# Patient Record
Sex: Female | Born: 1990 | Race: Black or African American | Hispanic: No | Marital: Single | Smoking: Former smoker
Health system: Southern US, Community
[De-identification: ages and names within clinical notes are randomized; demographics above are authoritative.]

## PROBLEM LIST (undated history)

## (undated) ENCOUNTER — Inpatient Hospital Stay (HOSPITAL_COMMUNITY): Payer: Self-pay

## (undated) DIAGNOSIS — Z789 Other specified health status: Secondary | ICD-10-CM

## (undated) HISTORY — PX: NO PAST SURGERIES: SHX2092

---

## 2014-09-05 ENCOUNTER — Emergency Department (HOSPITAL_COMMUNITY): Payer: Self-pay

## 2014-09-05 ENCOUNTER — Emergency Department (HOSPITAL_COMMUNITY)
Admission: EM | Admit: 2014-09-05 | Discharge: 2014-09-05 | Disposition: A | Discharge status: Home / Self Care | Payer: Self-pay | Attending: Emergency Medicine | Admitting: Emergency Medicine

## 2014-09-05 DIAGNOSIS — F10920 Alcohol use, unspecified with intoxication, uncomplicated: Secondary | ICD-10-CM

## 2014-09-05 DIAGNOSIS — F10129 Alcohol abuse with intoxication, unspecified: Secondary | ICD-10-CM | POA: Insufficient documentation

## 2014-09-05 LAB — RAPID URINE DRUG SCREEN, HOSP PERFORMED
AMPHETAMINES: NOT DETECTED
BENZODIAZEPINES: NOT DETECTED
Barbiturates: NOT DETECTED
COCAINE: NOT DETECTED
Opiates: NOT DETECTED
Tetrahydrocannabinol: NOT DETECTED

## 2014-09-05 LAB — COMPREHENSIVE METABOLIC PANEL
ALK PHOS: 56 U/L (ref 39–117)
ALT: 26 U/L (ref 0–35)
AST: 35 U/L (ref 0–37)
Albumin: 4.2 g/dL (ref 3.5–5.2)
Anion gap: 13 (ref 5–15)
BUN: 16 mg/dL (ref 6–23)
CALCIUM: 9.4 mg/dL (ref 8.4–10.5)
CO2: 25 meq/L (ref 19–32)
Chloride: 104 mEq/L (ref 96–112)
Creatinine, Ser: 0.95 mg/dL (ref 0.50–1.10)
GFR calc Af Amer: >90 mL/min (ref >90)
GFR, EST NON AFRICAN AMERICAN: 84 mL/min — AB (ref >90)
Glucose, Bld: 95 mg/dL (ref 70–99)
POTASSIUM: 3.9 meq/L (ref 3.7–5.3)
SODIUM: 142 meq/L (ref 137–147)
Total Bilirubin: 0.2 mg/dL — ABNORMAL LOW (ref 0.3–1.2)
Total Protein: 8 g/dL (ref 6.0–8.3)

## 2014-09-05 LAB — ETHANOL: ALCOHOL ETHYL (B): 335 mg/dL — AB (ref 0–11)

## 2014-09-05 LAB — CBC WITH DIFFERENTIAL/PLATELET
BASOS ABS: 0 10*3/uL (ref 0.0–0.1)
Basophils Relative: 1 % (ref 0–1)
EOS PCT: 5 % (ref 0–5)
Eosinophils Absolute: 0.4 10*3/uL (ref 0.0–0.7)
HCT: 38.8 % (ref 36.0–46.0)
Hemoglobin: 13 g/dL (ref 12.0–15.0)
LYMPHS ABS: 2.7 10*3/uL (ref 0.7–4.0)
LYMPHS PCT: 35 % (ref 12–46)
MCH: 29 pg (ref 26.0–34.0)
MCHC: 33.5 g/dL (ref 30.0–36.0)
MCV: 86.4 fL (ref 78.0–100.0)
Monocytes Absolute: 0.6 10*3/uL (ref 0.1–1.0)
Monocytes Relative: 8 % (ref 3–12)
Neutro Abs: 4.1 10*3/uL (ref 1.7–7.7)
Neutrophils Relative %: 51 % (ref 43–77)
Platelets: 244 10*3/uL (ref 150–400)
RBC: 4.49 MIL/uL (ref 3.87–5.11)
RDW: 12.6 % (ref 11.5–15.5)
WBC: 7.9 10*3/uL (ref 4.0–10.5)

## 2014-09-05 LAB — URINALYSIS, ROUTINE W REFLEX MICROSCOPIC
BILIRUBIN URINE: NEGATIVE
GLUCOSE, UA: NEGATIVE mg/dL
Hgb urine dipstick: NEGATIVE
KETONES UR: NEGATIVE mg/dL
Leukocytes, UA: NEGATIVE
Nitrite: NEGATIVE
Protein, ur: NEGATIVE mg/dL
Specific Gravity, Urine: 1.007 (ref 1.005–1.030)
Urobilinogen, UA: 0.2 mg/dL (ref 0.0–1.0)
pH: 6.5 (ref 5.0–8.0)

## 2014-09-05 LAB — PROTIME-INR
INR: 1 (ref 0.00–1.49)
Prothrombin Time: 13.3 seconds (ref 11.6–15.2)

## 2014-09-05 LAB — I-STAT CG4 LACTIC ACID, ED: Lactic Acid, Venous: 1.14 mmol/L (ref 0.5–2.2)

## 2014-09-05 MED ORDER — SODIUM CHLORIDE 0.9 % IV SOLN
1000.0000 mL | Freq: Once | INTRAVENOUS | Status: AC
  Administered 2014-09-05: 1000 mL via INTRAVENOUS

## 2014-09-05 MED ORDER — SODIUM CHLORIDE 0.9 % IV SOLN: 1000.0000 mL | INTRAVENOUS | Status: DC

## 2014-09-05 NOTE — Discharge Instructions (Signed)
Drink alcohol only in moderation.  Return to the ED for any concerning changes in your condition.

## 2014-09-05 NOTE — Progress Notes (Signed)
Chaplain present at pt arrival. Chaplain checked in with pt and made her aware of chaplain services. Chaplain unsure if pt understood based on current condition. Accd to nurse pt lives alone and mother lives out of town. Page chaplain if needed.   09/05/14 0300  Clinical Encounter Type  Visited With Patient;Health care provider  Visit Type Initial;Spiritual support  Stress Factors  Patient Stress Factors None identified  Cynithia Hakimi, Mayer MaskerCourtney F, Chaplain 09/05/2014 3:28 AM

## 2014-09-05 NOTE — ED Notes (Signed)
Food tray ordered

## 2014-09-05 NOTE — ED Notes (Signed)
Patient found naked standing at bedside with her IV pulled out. She was helped back to bed and cleaned up. EDP made aware that patient had no IV access and became combative when told we were going to put another one in. EDP okay with patient not having access. She has been sleeping.

## 2014-09-05 NOTE — ED Provider Notes (Signed)
Pt awake and alert.   Pt wants to go home.  Social worker provided [employer] engine[employer] voucher.  Lonia SkinnerLeslie K Coal Run VillageSofia, PA-C 09/05/14 1015

## 2014-09-05 NOTE — ED Provider Notes (Signed)
Please see my initial note  Gerhard Munchobert Jeffrey Voth, MD 09/05/14 2330
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
## 2015-01-24 ENCOUNTER — Emergency Department (HOSPITAL_COMMUNITY)
Admission: EM | Admit: 2015-01-24 | Discharge: 2015-01-24 | Disposition: A | Discharge status: Home / Self Care | Payer: Self-pay | Attending: Emergency Medicine | Admitting: Emergency Medicine

## 2015-01-24 ENCOUNTER — Emergency Department (HOSPITAL_COMMUNITY): Payer: Self-pay

## 2015-01-24 ENCOUNTER — Encounter (HOSPITAL_COMMUNITY): Payer: Self-pay

## 2015-01-24 DIAGNOSIS — R05 Cough: Secondary | ICD-10-CM | POA: Insufficient documentation

## 2015-01-24 DIAGNOSIS — R059 Cough, unspecified: Secondary | ICD-10-CM

## 2015-01-24 DIAGNOSIS — Z87891 Personal history of nicotine dependence: Secondary | ICD-10-CM | POA: Insufficient documentation

## 2015-01-24 DIAGNOSIS — Z3202 Encounter for pregnancy test, result negative: Secondary | ICD-10-CM | POA: Insufficient documentation

## 2015-01-24 DIAGNOSIS — R1110 Vomiting, unspecified: Secondary | ICD-10-CM | POA: Insufficient documentation

## 2015-01-24 LAB — CBC WITH DIFFERENTIAL/PLATELET
BASOS PCT: 1 % (ref 0–1)
Basophils Absolute: 0 10*3/uL (ref 0.0–0.1)
EOS PCT: 3 % (ref 0–5)
Eosinophils Absolute: 0.2 10*3/uL (ref 0.0–0.7)
HEMATOCRIT: 36.7 % (ref 36.0–46.0)
Hemoglobin: 12.4 g/dL (ref 12.0–15.0)
LYMPHS ABS: 1.3 10*3/uL (ref 0.7–4.0)
LYMPHS PCT: 24 % (ref 12–46)
MCH: 29.5 pg (ref 26.0–34.0)
MCHC: 33.8 g/dL (ref 30.0–36.0)
MCV: 87.2 fL (ref 78.0–100.0)
MONO ABS: 0.5 10*3/uL (ref 0.1–1.0)
MONOS PCT: 9 % (ref 3–12)
NEUTROS ABS: 3.5 10*3/uL (ref 1.7–7.7)
NEUTROS PCT: 63 % (ref 43–77)
Platelets: 261 10*3/uL (ref 150–400)
RBC: 4.21 MIL/uL (ref 3.87–5.11)
RDW: 12.9 % (ref 11.5–15.5)
WBC: 5.5 10*3/uL (ref 4.0–10.5)

## 2015-01-24 LAB — COMPREHENSIVE METABOLIC PANEL
ALBUMIN: 4.1 g/dL (ref 3.5–5.2)
ALT: 21 U/L (ref 0–35)
ANION GAP: 9 (ref 5–15)
AST: 23 U/L (ref 0–37)
Alkaline Phosphatase: 50 U/L (ref 39–117)
BUN: 9 mg/dL (ref 6–23)
CHLORIDE: 106 mmol/L (ref 96–112)
CO2: 25 mmol/L (ref 19–32)
Calcium: 9.1 mg/dL (ref 8.4–10.5)
Creatinine, Ser: 0.66 mg/dL (ref 0.50–1.10)
GLUCOSE: 78 mg/dL (ref 70–99)
Potassium: 3.6 mmol/L (ref 3.5–5.1)
Sodium: 140 mmol/L (ref 135–145)
Total Bilirubin: 0.6 mg/dL (ref 0.3–1.2)
Total Protein: 7.3 g/dL (ref 6.0–8.3)

## 2015-01-24 LAB — LIPASE, BLOOD: LIPASE: 35 U/L (ref 11–59)

## 2015-01-24 LAB — URINALYSIS, ROUTINE W REFLEX MICROSCOPIC
Bilirubin Urine: NEGATIVE
GLUCOSE, UA: NEGATIVE mg/dL
HGB URINE DIPSTICK: NEGATIVE
KETONES UR: NEGATIVE mg/dL
Leukocytes, UA: NEGATIVE
Nitrite: NEGATIVE
PH: 6.5 (ref 5.0–8.0)
Protein, ur: NEGATIVE mg/dL
Specific Gravity, Urine: 1.022 (ref 1.005–1.030)
Urobilinogen, UA: 1 mg/dL (ref 0.0–1.0)

## 2015-01-24 LAB — POC URINE PREG, ED: Preg Test, Ur: NEGATIVE

## 2015-01-24 MED ORDER — PROMETHAZINE HCL 25 MG PO TABS: 25.0000 mg | ORAL_TABLET | Freq: Four times a day (QID) | ORAL | Status: DC | PRN

## 2015-01-24 MED ORDER — BENZONATATE 100 MG PO CAPS: 100.0000 mg | ORAL_CAPSULE | Freq: Three times a day (TID) | ORAL | Status: DC

## 2015-01-24 NOTE — Discharge Instructions (Signed)
Your tests were all normal - tessalon for the cough, phenergan for any nausea - drink plenty of fluids.  Please call your doctor for a followup appointment within 24-48 hours. When you talk to your doctor please let them know that you were seen in the emergency department and have them acquire all of your records so that they can discuss the findings with you and formulate a treatment plan to fully care for your new and ongoing problems.   RESOURCE GUIDE  Chronic Pain Problems: Contact [facility]  [phone] Patients need to be referred by their primary care doctor.  Insufficient Money for Medicine: Contact United Way:  call "211."   No Primary Care Doctor: - Call Health Connect  [phone] - can help you locate a primary care doctor that  accepts your insurance, provides certain services, etc. - Physician Referral Service- [phone]  Agencies that provide inexpensive medical care: - [facility]  [phone] - [facility]  [phone] - [facility]  [phone] - Women's Clinic  [phone] - [facility] Parenthood  [phone] Haynes Bast[facility]  [phone]  Medicaid-accepting [facility] Providers: - [facility]- [address], [address]  [phone], Mon-Fri 9am-7pm, Sat 9am-1pm - [facility]- [address], [address] [place]  [phone] - [facility]- [address], [address] [place]  [phone] [facility]- [facility]- [address] [place] [address]  [phone] - Renaye RakersVeita Bland- [address], [address], [phone]  Only accepts [place] patients after they have their name  applied to their card  Self Pay (no insurance) in [place]: - Sickle Cell Patients: Dr Willey BladeEric Dean, [facility]  [address], [phone] - [facility]- [address]  [phone]       Redge Gainer-     [facility]- [address],  [address]       -     [facility]- see information above (Speak to Cit[employer]Pam H if you do not have insurance)       -  [facility] [place]- [address],  [phone]       -  [facility]- [address] [place] [address], [phone]       -  Dr Julio Sickssei-Bonsu-  [address], [address], [place], [phone]       -  [facility] - [address], [phone]       -  [facility]- [address] [place] [address], [phone], also [address], [phone]       -    [facility]- [address], [phone], 1st & 3rd Saturday        every month, 10am-1pm  [facility] [address] [place], [place] [address] [phone]  [facility] [address] [place], [place] [address] [phone]  1) Find a Doctor and Pay Out of Pocket Although you won't have to find out who is covered by your insurance plan, it is a good idea to ask around and get recommendations. You will then need to call the office and see if the doctor you have chosen will accept you as a new patient and what types of options they offer for patients who are self-pay. Some doctors offer discounts or will set up payment  plans for their patients who do not have insurance, but you will need to ask so you aren't surprised when you get to your appointment.  2) Contact Your Local Health Department Not all health departments have doctors that can see patients for sick visits, but many do, so it is worth a call to see if yours does. If you don't know where your local health department is, you can check in your phone book. The CDC also has a tool to help you locate your state's health department, and many state websites also have listings of all of their local health departments.  3) Find a Walk-in Clinic If your illness is not likely to be very severe or complicated, you may want to try a walk in clinic. These are popping up all over the country in pharmacies,  drugstores, and shopping centers. They're usually staffed by nurse practitioners or physician assistants that have been trained to treat common illnesses and complaints. They're usually fairly quick and inexpensive. However, if you have serious medical issues or chronic medical problems, these are probably not your best option  STD Testing - [facility] Department of [facility], STD Clinic, [address] [place], phone [phone] or [phone].  Monday - Friday, call for an appointment. [facility] Department of [employer], STD Clinic, [address], [place], phone [phone] or [phone].  Monday - Friday, call for an appointment.  Abuse/Neglect: [facility] Child Abuse Hotline 760-182-8096 [facility] Child Abuse Hotline [phone] (After Hours)  Emergency Shelter:  [facility] [phone]  Maternity Homes: - Room at the [place] [facility] [phone] - [facility] [phone]  MRSA Hotline #:   709-663-9220  Dental Assistance If unable to pay or uninsured, contact:  [facility]. to become qualified for the adult dental clinic.  Patients with Medicaid: [facility] [phone] [address], [phone] [address], [phone]  If unable to pay, or uninsured, contact [facility] [phone] in [place], [phone] in [facility]) to become qualified for the adult dental clinic  [facility] [address] [place], [place] [address] [phone] [email or web address]  Other Proofreader Services: - [facility]- [address], [place], [place], [address], [phone], Ext. [phone], 2nd and 4th Thursday of the month at 6:30am.  10 clients each day by appointment, can sometimes see walk-in patients if someone does not show for an appointment. [facility]- [address] [place],  [place], [address], [phone] - [facility]- [address], [place], [place], [address], [phone] - [facility]- [phone] [facility]- [phone] [facility] [phone] -

## 2015-01-24 NOTE — ED Notes (Addendum)
Pt states she's had abdominal pain with N/V for a week.  Pt states she's not able to keep anything down.  Pt also states she needs a tetanus shot.

## 2015-01-24 NOTE — ED Notes (Signed)
Patient returned from X-ray 

## 2015-01-24 NOTE — ED Notes (Signed)
Patient transported to X-ray 

## 2015-01-24 NOTE — ED Provider Notes (Signed)
CSN: [id number]     Arrival date & time 01/24/15  1003 History   First MD Initiated Contact with Patient 01/24/15 1030     Chief Complaint  Patient presents with  . Emesis  . 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 She also uses "popping pills" such as Xanax and Vicodin but prefers cocaine, drinks alcohol daily, has had a cough for approximately 2 weeks which is productive of phlegm, associated with mild fatigue and now states that she is having some nausea and diarrhea every time she eats. Her symptoms are persistent, gradually worsening, nothing makes this better or worse, not associated with rashes.  Patient is a 24 y.o. female presenting with vomiting and abdominal pain. The history is provided by the patient.  Emesis Associated symptoms: abdominal pain   Abdominal Pain Associated symptoms: vomiting     History reviewed. No pertinent past medical history. History reviewed. No pertinent past surgical history. No family history on file. History  Substance Use Topics  . Smoking status: Former Games developermoker  . Smokeless tobacco: Not on file  . Alcohol Use: Yes   OB History    No data available     Review of Systems  Gastrointestinal: Positive for vomiting and abdominal pain.  All other systems reviewed and are negative.     Allergies  Review of patient's allergies indicates no known allergies.  Home Medications   Prior to Admission medications   Medication Sig Start Date End Date Taking? Authorizing Provider  benzonatate (TESSALON) 100 MG capsule Take 1 capsule (100 mg total) by mouth every 8 (eight) hours. 01/24/15   Eber HongBrian Deola Rewis, MD  promethazine (PHENERGAN) 25 MG tablet Take 1 tablet (25 mg total) by mouth every 6 (six) hours as needed for nausea or vomiting. 01/24/15   Eber HongBrian  Janelis Stelzer, MD   BP 108/58 mmHg  Pulse 69  Temp(Src) 98.4 F (36.9 C) (Oral)  Resp 16  Ht 5\' 2"  (1.575 m)  Wt 133 lb 4 oz (60.442 kg)  BMI 24.37 kg/m2  SpO2 97%  LMP 11/25/2014 Physical Exam  Constitutional: She appears well-developed and well-nourished. No distress.  HENT:  Head: Normocephalic and atraumatic.  Mouth/Throat: Oropharynx is clear and moist. No oropharyngeal exudate.  Moist mucous membranes, oropharynx is clear and moist, nasal passages clear, phonation normal  Eyes: Conjunctivae and EOM are normal. Pupils are equal, round, and reactive to light. Right eye exhibits no discharge. Left eye exhibits no discharge. No scleral icterus.  Neck: Normal range of motion. Neck supple. No JVD present. No thyromegaly present.  No lymphadenopathy, very supple neck  Cardiovascular: Normal rate, regular rhythm, normal heart sounds and intact distal pulses.  Exam reveals no gallop and no friction rub.   No murmur heard. Pulmonary/Chest: Effort normal and breath sounds normal. No respiratory distress. She has no wheezes. She has no rales.  Normal lung sounds, speaks in full sentences, appears comfortable  Abdominal: Soft. Bowel sounds are normal. She exhibits no distension and no mass. There is no tenderness.  No abdominal tenderness  Musculoskeletal: Normal range of motion. She exhibits no edema or tenderness.  No signs of track marks, no edema, no swelling, soft compartments, supple joints  Lymphadenopathy:    She has no cervical adenopathy.  Neurological: She is alert. Coordination normal.  Skin: Skin is warm and dry. No rash noted. No erythema.  Psychiatric: She has a normal mood and affect. Her behavior is normal.  Nursing note and vitals reviewed.   ED Course  Procedures (including critical care time) Labs Review Labs Reviewed  CBC WITH DIFFERENTIAL/PLATELET  COMPREHENSIVE METABOLIC PANEL  LIPASE, BLOOD  URINALYSIS, ROUTINE W REFLEX MICROSCOPIC  POC URINE PREG, ED     Imaging Review Dg Chest 2 View  01/24/2015   CLINICAL DATA:  Productive cough for the last 3 or 4 weeks.  EXAM: CHEST  2 VIEW  COMPARISON:  09/05/2014  FINDINGS: The heart size and mediastinal contours are within normal limits. Both lungs are clear. The visualized skeletal structures are unremarkable.  IMPRESSION: No active cardiopulmonary disease.   Electronically Signed   By: Signa Kell M.D.   On: 01/24/2015 12:17      MDM   Final diagnoses:  Cough    Normal vital signs, ongoing coughing, nausea, rule out urinary infection or pregnancy, rule out pneumonia, labs as ordered by nursing however may be low yield this patient appears very well. I counseled the patient at length regarding her drug use and the need to stop, anticipate discharge unless significant findings.  She did not cough or have any difficulty breathing during my exam and evaluation  Labs normal - xray neg, stable for d/c. Referrals to PMD given.  Eber Hong, MD 01/24/15 1339

## 2015-06-11 ENCOUNTER — Emergency Department (HOSPITAL_COMMUNITY)
Admission: EM | Admit: 2015-06-11 | Discharge: 2015-06-11 | Disposition: A | Discharge status: Home / Self Care | Payer: Self-pay | Attending: Emergency Medicine | Admitting: Emergency Medicine

## 2015-06-11 ENCOUNTER — Encounter (HOSPITAL_COMMUNITY): Payer: Self-pay | Admitting: Emergency Medicine

## 2015-06-11 DIAGNOSIS — Z87891 Personal history of nicotine dependence: Secondary | ICD-10-CM | POA: Insufficient documentation

## 2015-06-11 DIAGNOSIS — Y9389 Activity, other specified: Secondary | ICD-10-CM | POA: Insufficient documentation

## 2015-06-11 DIAGNOSIS — W450XXA Nail entering through skin, initial encounter: Secondary | ICD-10-CM | POA: Insufficient documentation

## 2015-06-11 DIAGNOSIS — Z23 Encounter for immunization: Secondary | ICD-10-CM | POA: Insufficient documentation

## 2015-06-11 DIAGNOSIS — S61219A Laceration without foreign body of unspecified finger without damage to nail, initial encounter: Secondary | ICD-10-CM

## 2015-06-11 DIAGNOSIS — Y998 Other external cause status: Secondary | ICD-10-CM | POA: Insufficient documentation

## 2015-06-11 DIAGNOSIS — Y9289 Other specified places as the place of occurrence of the external cause: Secondary | ICD-10-CM | POA: Insufficient documentation

## 2015-06-11 DIAGNOSIS — S61214A Laceration without foreign body of right ring finger without damage to nail, initial encounter: Secondary | ICD-10-CM | POA: Insufficient documentation

## 2015-06-11 MED ORDER — TETANUS-DIPHTH-ACELL PERTUSSIS 5-2.5-18.5 LF-MCG/0.5 IM SUSP
0.5000 mL | Freq: Once | INTRAMUSCULAR | Status: AC
  Administered 2015-06-11: 0.5 mL via INTRAMUSCULAR
  Filled 2015-06-11: qty 0.5

## 2015-06-11 MED ORDER — CEPHALEXIN 500 MG PO CAPS: 500.0000 mg | ORAL_CAPSULE | Freq: Four times a day (QID) | ORAL | Status: DC

## 2015-06-11 MED ORDER — HYDROCODONE-ACETAMINOPHEN 5-325 MG PO TABS: 1.0000 | ORAL_TABLET | Freq: Four times a day (QID) | ORAL | Status: DC | PRN

## 2015-06-11 MED ORDER — LIDOCAINE HCL (PF) 1 % IJ SOLN
5.0000 mL | Freq: Once | INTRAMUSCULAR | Status: AC
  Administered 2015-06-11: 5 mL
  Filled 2015-06-11: qty 5

## 2015-06-11 NOTE — ED Notes (Signed)
Caught right ring finger on a nail at around 0100 this am. Deep lac to finger.

## 2015-06-11 NOTE — Discharge Instructions (Signed)
Absorbable Suture Repair °Absorbable sutures (stitches) hold skin together so you can heal. Keep skin wounds clean and dry for the next 2 to 3 days. Then, you may gently wash your wound and dress it with an antibiotic ointment as recommended. As your wound begins to heal, the sutures are no longer needed, and they typically begin to fall off. This will take 7 to 10 days. After 10 days, if your sutures are loose, you can remove them by wiping with a clean gauze pad or a cotton ball. Do not pull your sutures out. They should wipe away easily. If after 10 days they do not easily wipe away, have your caregiver take them out. Absorbable sutures may be used deep in a wound to help hold it together. If these stitches are below the skin, the body will absorb them completely in 3 to 4 weeks.  °You may need a tetanus shot if: °· You cannot remember when you had your last tetanus shot. °· You have never had a tetanus shot. °If you get a tetanus shot, your arm may swell, get red, and feel warm to the touch. This is common and not a problem. If you need a tetanus shot and you choose not to have one, there is a rare chance of getting tetanus. Sickness from tetanus can be serious. °SEEK IMMEDIATE MEDICAL CARE IF: °· You have redness in the wound area. °· The wound area feels hot to the touch. °· You develop swelling in the wound area. °· You develop pain. °· There is fluid drainage from the wound. °Document Released: 12/07/2004 Document Revised: 01/22/2012 Document Reviewed: 03/21/2011 °ExitCare® Patient Information ©2015 [employer]. This information is not intended to replace advice given to you by your health care provider. Make sure you discuss any questions you have with your health care provider. ° °

## 2015-06-11 NOTE — ED Provider Notes (Signed)
CSN: [id number]     Arrival date & time 06/11/15  3664 History   This chart was scribed for non-physician practitioner, Roxy Horseman, PA-C, working with Rolland Porter, MD by Charline Bills, ED Scribe. This patient was seen in room TR08C/TR08C and the patient's care was started at 10:09 AM.  Chief Complaint  Patient presents with  . Laceration   The history is provided by the patient. No language interpreter was used.   HPI Comments: Chloe Saunders is a 24 y.o. female who presents to the Emergency Department with a chief complaint of a laceration to right ring finger sustained around 10 PM last night. Pt states that she was moving a table when she cut her finger on a nail. She reports associated moderate pain to the affected area that is exacerbated with bending. Bleeding is controlled at this time. Pt's last tetanus is unknown. No h/o DM, HIV or hepatitis.   History reviewed. No pertinent past medical history. History reviewed. No pertinent past surgical history. No family history on file. History  Substance Use Topics  . Smoking status: Former Games developer  . Smokeless tobacco: Not on file  . Alcohol Use: Yes   OB History    No data available     Review of Systems  Constitutional: Negative for fever and chills.  Respiratory: Negative for shortness of breath.   Cardiovascular: Negative for chest pain.  Gastrointestinal: Negative for nausea, vomiting, diarrhea and constipation.  Genitourinary: Negative for dysuria.  Skin: Positive for wound.   Allergies  Review of patient's allergies indicates no known allergies.  Home Medications   Prior to Admission medications   Medication Sig Start Date End Date Taking? Authorizing Provider  benzonatate (TESSALON) 100 MG capsule Take 1 capsule (100 mg total) by mouth every 8 (eight) hours. 01/24/15   Eber Hong, MD  promethazine (PHENERGAN) 25 MG tablet Take 1 tablet (25 mg total) by mouth every 6 (six) hours as needed for nausea or vomiting.  01/24/15   Eber Hong, MD   BP 160/90 mmHg  Pulse 98  Temp(Src) 98.1 F (36.7 C) (Oral)  Resp 19  Ht 5' (1.524 m)  Wt 120 lb (54.432 kg)  BMI 23.44 kg/m2  SpO2 97% Physical Exam  Constitutional: She is oriented to person, place, and time. She appears well-developed and well-nourished. No distress.  HENT:  Head: Normocephalic and atraumatic.  Eyes: Conjunctivae and EOM are normal.  Neck: Neck supple. No tracheal deviation present.  Cardiovascular: Normal rate and intact distal pulses.   Good cap refill.   Pulmonary/Chest: Effort normal. No respiratory distress.  Musculoskeletal: Normal range of motion.  R ring finger flexion and extension strength and ROM 5/5 at isolated interphalangeal joints  Neurological: She is alert and oriented to person, place, and time.  Skin: Skin is warm and dry.  R ring finger: 3 cm linear laceration extending from the R ring distal phalanx to the distal portion of the middle phalanx. No tendon involvement. No foreign body.   Psychiatric: She has a normal mood and affect. Her behavior is normal.  Nursing note and vitals reviewed.  ED Course  Procedures (including critical care time) DIAGNOSTIC STUDIES: Oxygen Saturation is 97% on RA, normal by my interpretation.    LACERATION REPAIR PROCEDURE NOTE The patient's identification was confirmed and consent was obtained. This procedure was performed by Roxy Horseman, PA-C at 11:40 AM. Site: R ring finger Sterile procedures observed Anesthetic used (type and amt): 4 mL of 1% lidocaine without epinephrine  Suture type/size: 5-0 vicryl plus Length: 3 cm # of Sutures: 6 Technique: simple interrupted  Complexity Antibx ointment applied Tetanus ordered Site anesthetized, irrigated with NS, explored without evidence of foreign body, wound well approximated, site covered with dry, sterile dressing.  Patient tolerated procedure well without complications. Instructions for care discussed verbally and  patient provided with additional written instructions for homecare and f/u.  COORDINATION OF CARE: 10:12 AM-Discussed treatment plan which includes sutures  with pt at bedside and pt agreed to plan.   Labs Review Labs Reviewed - No data to display  Imaging Review No results found.   EKG Interpretation None      MDM   Final diagnoses:  Finger laceration, initial encounter    Patient with finger laceration that happened last night.  Some concern about length of time from injury and delayed wound closure.  Discussed with Dr. Fayrene Fearing, who recommends proceeding with primary wound closure provided aggressive rinsing and scrubbing, which was done in the ED.  Will prescribe keflex.  Tdap updated.  Return precautions given.  Patient understands and agrees with the plan.  I personally performed the services described in this documentation, which was scribed in my presence. The recorded information has been reviewed and is accurate.     Roxy Horseman, PA-C 06/11/15 1222  Rolland Porter, MD 06/18/15 [phone]

## 2015-10-25 ENCOUNTER — Emergency Department (HOSPITAL_COMMUNITY)
Admission: EM | Admit: 2015-10-25 | Discharge: 2015-10-26 | Disposition: A | Discharge status: Other Acute Inpatient Hospital | Payer: Medicaid Other | Attending: Emergency Medicine | Admitting: Emergency Medicine

## 2015-10-25 ENCOUNTER — Encounter (HOSPITAL_COMMUNITY): Payer: Self-pay | Admitting: Emergency Medicine

## 2015-10-25 ENCOUNTER — Emergency Department (HOSPITAL_COMMUNITY)
Admission: EM | Admit: 2015-10-25 | Discharge: 2015-10-25 | Disposition: A | Discharge status: Home / Self Care | Payer: Self-pay

## 2015-10-25 DIAGNOSIS — F1320 Sedative, hypnotic or anxiolytic dependence, uncomplicated: Secondary | ICD-10-CM | POA: Insufficient documentation

## 2015-10-25 DIAGNOSIS — F17210 Nicotine dependence, cigarettes, uncomplicated: Secondary | ICD-10-CM | POA: Insufficient documentation

## 2015-10-25 DIAGNOSIS — Z3202 Encounter for pregnancy test, result negative: Secondary | ICD-10-CM | POA: Insufficient documentation

## 2015-10-25 DIAGNOSIS — F1920 Other psychoactive substance dependence, uncomplicated: Secondary | ICD-10-CM

## 2015-10-25 DIAGNOSIS — F1420 Cocaine dependence, uncomplicated: Secondary | ICD-10-CM | POA: Insufficient documentation

## 2015-10-25 DIAGNOSIS — R45851 Suicidal ideations: Secondary | ICD-10-CM | POA: Insufficient documentation

## 2015-10-25 DIAGNOSIS — F1010 Alcohol abuse, uncomplicated: Secondary | ICD-10-CM

## 2015-10-25 LAB — COMPREHENSIVE METABOLIC PANEL
ALT: 45 U/L (ref 14–54)
AST: 66 U/L — ABNORMAL HIGH (ref 15–41)
Albumin: 4.8 g/dL (ref 3.5–5.0)
Alkaline Phosphatase: 56 U/L (ref 38–126)
Anion gap: 15 (ref 5–15)
BILIRUBIN TOTAL: 1.3 mg/dL — AB (ref 0.3–1.2)
BUN: 16 mg/dL (ref 6–20)
CHLORIDE: 106 mmol/L (ref 101–111)
CO2: 20 mmol/L — ABNORMAL LOW (ref 22–32)
Calcium: 9 mg/dL (ref 8.9–10.3)
Creatinine, Ser: 0.63 mg/dL (ref 0.44–1.00)
GFR calc Af Amer: >60 mL/min (ref >60)
Glucose, Bld: 85 mg/dL (ref 65–99)
POTASSIUM: 4 mmol/L (ref 3.5–5.1)
Sodium: 141 mmol/L (ref 135–145)
TOTAL PROTEIN: 8.1 g/dL (ref 6.5–8.1)

## 2015-10-25 LAB — CBC
HCT: 37.5 % (ref 36.0–46.0)
HEMOGLOBIN: 12.4 g/dL (ref 12.0–15.0)
MCH: 29.3 pg (ref 26.0–34.0)
MCHC: 33.1 g/dL (ref 30.0–36.0)
MCV: 88.7 fL (ref 78.0–100.0)
Platelets: 213 10*3/uL (ref 150–400)
RBC: 4.23 MIL/uL (ref 3.87–5.11)
RDW: 13.3 % (ref 11.5–15.5)
WBC: 8 10*3/uL (ref 4.0–10.5)

## 2015-10-25 LAB — ETHANOL: ALCOHOL ETHYL (B): 313 mg/dL — AB (ref <5)

## 2015-10-25 LAB — RAPID URINE DRUG SCREEN, HOSP PERFORMED
Amphetamines: NOT DETECTED
BARBITURATES: NOT DETECTED
Benzodiazepines: POSITIVE — AB
COCAINE: POSITIVE — AB
OPIATES: NOT DETECTED
TETRAHYDROCANNABINOL: NOT DETECTED

## 2015-10-25 LAB — I-STAT BETA HCG BLOOD, ED (MC, WL, AP ONLY)

## 2015-10-25 MED ORDER — HYDROXYZINE HCL 25 MG PO TABS: 25.0000 mg | ORAL_TABLET | Freq: Four times a day (QID) | ORAL | Status: DC | PRN

## 2015-10-25 MED ORDER — LORAZEPAM 1 MG PO TABS: 1.0000 mg | ORAL_TABLET | Freq: Four times a day (QID) | ORAL | Status: DC | PRN

## 2015-10-25 MED ORDER — LORAZEPAM 1 MG PO TABS: 1.0000 mg | ORAL_TABLET | Freq: Three times a day (TID) | ORAL | Status: DC

## 2015-10-25 MED ORDER — LOPERAMIDE HCL 2 MG PO CAPS: 2.0000 mg | ORAL_CAPSULE | ORAL | Status: DC | PRN

## 2015-10-25 MED ORDER — LORAZEPAM 1 MG PO TABS: 0.0000 mg | ORAL_TABLET | Freq: Four times a day (QID) | ORAL | Status: DC

## 2015-10-25 MED ORDER — VITAMIN B-1 100 MG PO TABS: 100.0000 mg | ORAL_TABLET | Freq: Every day | ORAL | Status: DC

## 2015-10-25 MED ORDER — LORAZEPAM 1 MG PO TABS: 1.0000 mg | ORAL_TABLET | Freq: Two times a day (BID) | ORAL | Status: DC

## 2015-10-25 MED ORDER — LORAZEPAM 0.5 MG PO TABS
0.5000 mg | ORAL_TABLET | Freq: Once | ORAL | Status: AC
  Administered 2015-10-25: 0.5 mg via ORAL
  Filled 2015-10-25: qty 1

## 2015-10-25 MED ORDER — LORAZEPAM 1 MG PO TABS: 1.0000 mg | ORAL_TABLET | Freq: Four times a day (QID) | ORAL | Status: DC

## 2015-10-25 MED ORDER — THIAMINE HCL 100 MG/ML IJ SOLN
100.0000 mg | Freq: Once | INTRAMUSCULAR | Status: AC
  Administered 2015-10-25: 100 mg via INTRAMUSCULAR
  Filled 2015-10-25: qty 2

## 2015-10-25 MED ORDER — ADULT MULTIVITAMIN W/MINERALS CH
1.0000 | ORAL_TABLET | Freq: Every day | ORAL | Status: DC
  Administered 2015-10-25: 1 via ORAL
  Filled 2015-10-25: qty 1

## 2015-10-25 MED ORDER — LORAZEPAM 1 MG PO TABS: 0.0000 mg | ORAL_TABLET | Freq: Two times a day (BID) | ORAL | Status: DC

## 2015-10-25 MED ORDER — ONDANSETRON 4 MG PO TBDP: 4.0000 mg | ORAL_TABLET | Freq: Four times a day (QID) | ORAL | Status: DC | PRN

## 2015-10-25 MED ORDER — LORAZEPAM 1 MG PO TABS: 1.0000 mg | ORAL_TABLET | Freq: Every day | ORAL | Status: DC

## 2015-10-25 NOTE — ED Notes (Addendum)
Pt resting quietly in room.  15 minute checks and video monitoring in place.

## 2015-10-25 NOTE — ED Notes (Signed)
Pt wanded by security.  Pt has one patient belonging bag at Triage nurses' desk

## 2015-10-25 NOTE — ED Provider Notes (Signed)
CSN: [id number]     Arrival date & time 10/25/15  1534 History   First MD Initiated Contact with Patient 10/25/15 1621     Chief Complaint  Patient presents with  . detox    . Suicidal     (Consider location/radiation/quality/duration/timing/severity/associated sxs/prior Treatment) HPI   Chloe Saunders is a 24 y.o. female, patient with no significant past medical history, presenting to the ED for alcohol and drug detox and complains of SI. Patient states that she drinks about a gallon of hard liquor per day and has been doing so since she was 16. Patient states that she is sad because she has lost her cousin and her grandmother, both of whom she was close to. Patient states that she is made suicide attempts in the past, to include hanging and drug overdose. Patient states, "I just want to die I've nothing to live for." Patient states, "I plan to hang myself with my belt in the closet. I want to be with Jesus." Patient denies any homicidal ideations. Patient denies any physical pain currently. Patient last drug use was cocaine 3 days ago.    History reviewed. No pertinent past medical history. History reviewed. No pertinent past surgical history. No family history on file. Social History  Substance Use Topics  . Smoking status: Current Every Day Smoker    Types: Cigarettes  . Smokeless tobacco: None  . Alcohol Use: Yes   OB History    No data available     Review of Systems  Psychiatric/Behavioral:       Drug and alcohol detox and suicidal ideations.      Allergies  Review of patient's allergies indicates no known allergies.  Home Medications   Prior to Admission medications   Medication Sig Start Date End Date Taking? Authorizing Provider  naphazoline-pheniramine (NAPHCON-A) 0.025-0.3 % ophthalmic solution Place 1 drop into both eyes 4 (four) times daily as needed for irritation.   Yes Historical Provider, MD   BP 141/87 mmHg  Pulse 98  Temp(Src) 98.5 F (36.9 C)  (Oral)  Resp 24  SpO2 98%  LMP 09/25/2015 Physical Exam  Constitutional: She is oriented to person, place, and time. She appears well-developed and well-nourished. No distress.  HENT:  Head: Normocephalic and atraumatic.  Eyes: Conjunctivae are normal. Pupils are equal, round, and reactive to light.  Cardiovascular: Normal rate, regular rhythm and normal heart sounds.   Pulmonary/Chest: Effort normal and breath sounds normal. No respiratory distress.  Abdominal: Soft. Bowel sounds are normal.  Musculoskeletal: She exhibits no edema or tenderness.  Neurological: She is alert and oriented to person, place, and time.  Skin: Skin is warm and dry. She is not diaphoretic.  Psychiatric:  Patient changes the volume of her voice frequently and seems to begin talking quickly and excitedly for no reason. The patient comments are inappropriate at times, however answers to questions seem to be appropriate. Patient became tearful during the interview. Patient had flight of ideas and would jump from one subject to the next.  Nursing note and vitals reviewed.   ED Course  Procedures (including critical care time) Labs Review Labs Reviewed  COMPREHENSIVE METABOLIC PANEL - Abnormal; Notable for the following:    CO2 20 (*)    AST 66 (*)    Total Bilirubin 1.3 (*)    All other components within normal limits  ETHANOL - Abnormal; Notable for the following:    Alcohol, Ethyl (B) 313 (*)    All other components within normal  limits  URINE RAPID DRUG SCREEN, HOSP PERFORMED - Abnormal; Notable for the following:    Cocaine POSITIVE (*)    Benzodiazepines POSITIVE (*)    All other components within normal limits  CBC  I-STAT BETA HCG BLOOD, ED ([facility], [facility], [facility] ONLY)    Imaging Review No results found. I have personally reviewed and evaluated these images and lab results as part of my medical decision-making.   EKG Interpretation None      MDM   Final diagnoses:  Suicidal ideations  Alcohol  abuse  Drug abuse and dependence (HCC)    Chloe Saunders presents for alcohol and drug detox and expresses suicidal ideations.  This patient has been medically cleared and has no home medications to reorder. Patient was put on CIWA due to her alcohol use. Patient's vitals are stable and within acceptable limits. Patient's UDS was positive for cocaine and benzodiazepines, which is consistent with patient's admission of use.  Anselm Pancoast, PA-C 10/25/15 1834  Lorre Nick, MD 10/28/15 [phone]

## 2015-10-25 NOTE — ED Notes (Signed)
[facility]inda[facility]. Pending [facility] transfer.

## 2015-10-25 NOTE — Discharge Instructions (Signed)
You are discharged to the behavioral health unit

## 2015-10-25 NOTE — ED Notes (Signed)
[facility] transport states may be after 11:30pm for transport.

## 2015-10-25 NOTE — BH Assessment (Signed)
Assessment Note  Chloe Saunders is an 24 y.o. female [facility]. Patient states that she drinks about a gallon of hard liquor per day and has been doing so since she was 16. Patient states that she is sad because she has lost her cousin and her grandmother. She is also sad because she has a bad relationship with her mother. Sts, "I need disability" as well. Writer asked if she is suicidal and she sts, "Hell yeah all the damn time". Patient feels overwhelmed with stress and reports suicidal ideations with plan(s). She has a plan to overdose or hang self with belt in the closet. She has access to pills. She is unable to contract for safety. Patient has a prior history of suicide attempt by overdose. Patient reports depressive symptoms of hopelessness, despair, isolating self from others, and loss of interest in usual pleasures. She has bouts of anxiety. She reports poor sleep and appetite.  Patient reports homicidal thoughts toward, "Chloe Saunders". Sts, "I want to kill that bitch". Patient does not provide further information about "Chloe Saunders". The relationship is unknown. Patient refused to describe reasoning for her HI toward "Chloe Saunders". She denies history of harm to others. She has legal issues associated with trespassing. Patient has to go to court for the charge but doesn't know the date.  Patient denies AVH's. She has a history of substance abuse: Alcohol, Crack Cocaine, and Heroin. SEE ADDITIONAL SOCIAL HISTORY. She denies withdrawal symptoms. No history of seizures.She has a family history of substance abuse stating, "Everyone in my family uses drugs or drinks".   [facility] She denies history of inpatient mental health/substance abuse hospitalizations.    Diagnosis: Alcohol induced mood disorder, Alcohol dependence with uncomplicated withdrawal, Cocaine abuse  Past Medical History: History reviewed. No pertinent past medical  history.  History reviewed. No pertinent past surgical history.  Family History: No family history on file.  Social History:  reports that she has been smoking Cigarettes.  She does not have any smokeless tobacco history on file. She reports that she drinks alcohol. She reports that she uses illicit drugs (Cocaine and Marijuana).  Additional Social History:  Substance #1 Name of Substance 1: alcohol 1 - Age of First Use: 16 1 - Amount (size/oz): "gallons" 1 - Frequency: daily 1 - Duration: 8 years 1 - Last Use / Amount: 12/12 Substance #2 Name of Substance 2: crack/cocaine 2 - Age of First Use: 18 2 - Amount (size/oz): $20  2 - Frequency: daily 2 - Duration: 6 years 2 - Last Use / Amount: 12/12 Substance #3 Name of Substance 3: heroin 3 - Age of First Use: 18 3 - Amount (size/oz): "1 stick" 3 - Frequency: daily 3 - Duration: 6 years 3 - Last Use / Amount: 12/11  CIWA: CIWA-Ar BP: 141/87 mmHg Pulse Rate: 98 COWS:    Allergies: No Known Allergies  Home Medications:  (Not in a hospital admission)  OB/GYN Status:  Patient's last menstrual period was 09/25/2015.  General Assessment Data Location of Assessment: [facility] TTS Assessment: In system Is this a Tele or Face-to-Face Assessment?: Face-to-Face Is this an Initial Assessment or a Re-assessment for this encounter?: Initial Assessment Marital status: Single Maiden name:  ([place]) Is patient pregnant?: No Pregnancy Status: No Living Arrangements: Other (Comment) (homeless) Can pt return to current living arrangement?: Yes Admission Status: Voluntary Is patient capable of signing voluntary admission?: Yes Referral Source: Self/Family/Friend Insurance type:  (Medicaid)  Crisis Care Plan Living Arrangements: Other (Comment) (homeless) Legal Guardian:  (patient denies ) Name of Psychiatrist:  Vesta Saunders) Name of Therapist:  Vesta Saunders )  Education Status Is patient currently in school?: No Current Grade:   (n/a) Highest grade of 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 : Yes (held a knife to her throat) Suicidal Intent: Yes-Currently Present Has patient had any suicidal intent within the past 6 months prior to admission? : Yes Is patient at risk for suicide?: Yes Suicidal Plan?: Yes-Currently Present Has patient had any suicidal plan within the past 6 months prior to admission? : Yes (overdose) Specify Current Suicidal Plan: overdose Access to Means: Yes Specify Access to Suicidal Means: pill access What has been your use of drugs/alcohol within the last 12 months?: alcohol daily, crack/cocaine daily, heroin sporadically along with OTC pills Previous Attempts/Gestures: Yes How many times?: 1 Other Self Harm Risks: drug and alcohol use Triggers for Past Attempts: Family contact Intentional Self Injurious Behavior: None Family Suicide History: No Recent stressful life event(s): Legal Issues, Conflict (Comment), Financial Problems (family issues) Persecutory voices/beliefs?: No Depression: Yes Depression Symptoms: Insomnia, Tearfulness, Fatigue, Loss of interest in usual pleasures, Feeling worthless/self pity Substance abuse history and/or treatment for substance abuse?: Yes Suicide prevention information given to non-admitted patients: Not applicable  Risk to Others within the past 6 months Homicidal Ideation: Yes-Currently Present Does patient have any lifetime risk of violence toward others beyond the six months prior to admission? : No Thoughts of Harm to Others: Yes-Currently Present Comment - Thoughts of Harm to Others: "want to kill that bitch", Chloe Saunders Current Homicidal Intent: No Current Homicidal Plan: No Access to Homicidal Means: No Identified Victim:  Chloe Saunders History of harm to others?: No Assessment of Violence: None Noted Violent Behavior Description:  (patient cooperative) Does patient have access to weapons?: No Criminal Charges Pending?: Yes Describe Pending Criminal Charges: trespassing Does patient have a court date: Yes Court Date:  (unsure) Is patient on probation?: No  Psychosis Hallucinations: None noted Delusions: None noted  Mental Status Report Appearance/Hygiene: In scrubs, Unremarkable Eye Contact: Fair Motor Activity: Unsteady Speech: Loud, Slurred Level of Consciousness: Restless Mood: Anxious, Silly, Pleasant Affect: Blunted Anxiety Level: Moderate Thought Processes: Tangential Judgement: Impaired Orientation: Person, Place, Time, Situation Obsessive Compulsive Thoughts/Behaviors: None  Cognitive Functioning Concentration: Poor Memory: Recent Intact, Remote Intact IQ: Average Insight: Fair Impulse Control: Fair Appetite: Fair Weight Loss:  (none) Weight Gain:  (none) Sleep: Decreased Total Hours of Sleep:  (4-6 hours) Vegetative Symptoms: None  ADLScreening [facility] Assessment Services) Patient's cognitive ability adequate to safely complete daily activities?: Yes Patient able to express need for assistance with ADLs?: Yes Independently performs ADLs?: Yes (appropriate for developmental age)  Prior Inpatient Therapy Prior Inpatient Therapy: No Prior Therapy Dates:  (none) Prior Therapy Facilty/Provider(s):  (none) Reason for Treatment:  (none)  Prior Outpatient Therapy Prior Therapy Dates:  (once) Prior Therapy Facilty/Provider(s):  Museum/gallery curator) Reason for Treatment:  (depression, alcohol dependence) Does patient have an ACCT team?: No Does patient have Intensive In-House Services?  : No Does patient have [facility] services? : Yes Does patient have P4[facility] services?: No  ADL Screening (condition at time of admission) Patient's cognitive ability adequate to safely complete daily activities?:  Yes Is the patient deaf or have  difficulty hearing?: No Does the patient have difficulty seeing, even when wearing glasses/contacts?: No Does the patient have difficulty concentrating, remembering, or making decisions?: No Patient able to express need for assistance with ADLs?: Yes Does the patient have difficulty dressing or bathing?: No Independently performs ADLs?: Yes (appropriate for developmental age) Does the patient have difficulty walking or climbing stairs?: No Weakness of Legs: None Weakness of Arms/Hands: None  Home Assistive Devices/Equipment Home Assistive Devices/Equipment: None    Abuse/Neglect Assessment (Assessment to be complete while patient is alone) Physical Abuse: Denies Verbal Abuse: Denies Sexual Abuse: Denies Exploitation of patient/patient's resources: Denies Self-Neglect: Denies     Merchant navy officer (For Healthcare) Does patient have an advance directive?: No Would patient like information on creating an advanced directive?: No - patient declined information    Additional Information 1:1 In Past 12 Months?: No CIRT Risk: No Elopement Risk: No Does patient have medical clearance?: Yes  Child/Adolescent Assessment Running Away Risk: Denies Bed-Wetting: Denies Destruction of Property: Denies Cruelty to Animals: Denies Stealing: Denies Rebellious/Defies Authority: Denies Satanic Involvement: Denies Archivist: Denies Problems at [employer]: Denies Gang Involvement: Denies  Disposition:  Disposition Initial Assessment Completed for this Encounter: Yes Disposition of Patient: Inpatient treatment program [facility]ine Means accepts to [facility] 300 hall) Type of inpatient treatment program: Adult (300 hall)  On Site Evaluation by:   Reviewed with Physician:    Octaviano Batty 10/25/2015 6:30 PM

## 2015-10-25 NOTE — Progress Notes (Signed)
[facility]. Rosey BathKelly Christasia Angeletti, RN

## 2015-10-25 NOTE — ED Notes (Addendum)
Pt states that she doesn't haven't any family bc they are all deceased.  Pt states that she just lives here and there wth whomever.  Pt states that she has a drinking problem and been driking a lot. Pt also states that she smokes THC, heroin, and cocaine.  Pt states that she "just want to die".  Pt states that she has thoughts of hanging herself.

## 2015-10-25 NOTE — ED Notes (Signed)
Unable to obtain EKG, pt refusing to lay still during procedure, will attempt later.

## 2015-10-25 NOTE — ED Notes (Signed)
Pt sleeping at present, easily arouseable to verbal stimuli, awake, alert & responsive, no distress noted.  Monitoring for safety, Q 15 min checks in effect.

## 2015-10-26 ENCOUNTER — Encounter (HOSPITAL_COMMUNITY): Payer: Self-pay

## 2015-10-26 ENCOUNTER — Inpatient Hospital Stay (HOSPITAL_COMMUNITY)
Admission: AD | Admit: 2015-10-26 | Discharge: 2015-10-27 | DRG: 897 | Disposition: A | Discharge status: Home / Self Care | Payer: Federal, State, Local not specified - Other | Source: Intra-hospital | Attending: Psychiatry | Admitting: Psychiatry

## 2015-10-26 DIAGNOSIS — F10220 Alcohol dependence with intoxication, uncomplicated: Secondary | ICD-10-CM | POA: Diagnosis present

## 2015-10-26 DIAGNOSIS — F10229 Alcohol dependence with intoxication, unspecified: Principal | ICD-10-CM | POA: Diagnosis present

## 2015-10-26 DIAGNOSIS — F17210 Nicotine dependence, cigarettes, uncomplicated: Secondary | ICD-10-CM | POA: Diagnosis present

## 2015-10-26 DIAGNOSIS — G4700 Insomnia, unspecified: Secondary | ICD-10-CM | POA: Diagnosis present

## 2015-10-26 DIAGNOSIS — R45850 Homicidal ideations: Secondary | ICD-10-CM | POA: Diagnosis present

## 2015-10-26 DIAGNOSIS — F1994 Other psychoactive substance use, unspecified with psychoactive substance-induced mood disorder: Secondary | ICD-10-CM | POA: Diagnosis not present

## 2015-10-26 DIAGNOSIS — F410 Panic disorder [episodic paroxysmal anxiety] without agoraphobia: Secondary | ICD-10-CM | POA: Diagnosis present

## 2015-10-26 DIAGNOSIS — F329 Major depressive disorder, single episode, unspecified: Secondary | ICD-10-CM | POA: Diagnosis present

## 2015-10-26 DIAGNOSIS — F1414 Cocaine abuse with cocaine-induced mood disorder: Secondary | ICD-10-CM | POA: Diagnosis not present

## 2015-10-26 DIAGNOSIS — R45851 Suicidal ideations: Secondary | ICD-10-CM | POA: Diagnosis not present

## 2015-10-26 MED ORDER — NAPROXEN 500 MG PO TABS
500.0000 mg | ORAL_TABLET | Freq: Two times a day (BID) | ORAL | Status: DC | PRN
  Administered 2015-10-26: 500 mg via ORAL
  Filled 2015-10-26: qty 1

## 2015-10-26 MED ORDER — CLONIDINE HCL 0.1 MG PO TABS
0.1000 mg | ORAL_TABLET | ORAL | Status: DC
Start: 2015-10-28 — End: 2015-10-27
  Filled 2015-10-26 (×2): qty 1

## 2015-10-26 MED ORDER — VITAMIN B-1 100 MG PO TABS
100.0000 mg | ORAL_TABLET | Freq: Every day | ORAL | Status: DC
  Administered 2015-10-27: 100 mg via ORAL
  Filled 2015-10-26 (×3): qty 1

## 2015-10-26 MED ORDER — ALUM & MAG HYDROXIDE-SIMETH 200-200-20 MG/5ML PO SUSP: 30.0000 mL | ORAL | Status: DC | PRN

## 2015-10-26 MED ORDER — PNEUMOCOCCAL VAC POLYVALENT 25 MCG/0.5ML IJ INJ: 0.5000 mL | INJECTION | INTRAMUSCULAR | Status: DC

## 2015-10-26 MED ORDER — INFLUENZA VAC SPLIT QUAD 0.5 ML IM SUSY
0.5000 mL | PREFILLED_SYRINGE | INTRAMUSCULAR | Status: DC
  Filled 2015-10-26: qty 0.5

## 2015-10-26 MED ORDER — CLONIDINE HCL 0.1 MG PO TABS: 0.1000 mg | ORAL_TABLET | Freq: Every day | ORAL | Status: DC

## 2015-10-26 MED ORDER — CHLORDIAZEPOXIDE HCL 25 MG PO CAPS
25.0000 mg | ORAL_CAPSULE | Freq: Three times a day (TID) | ORAL | Status: DC
Start: 2015-10-27 — End: 2015-10-27

## 2015-10-26 MED ORDER — THIAMINE HCL 100 MG/ML IJ SOLN: 100.0000 mg | Freq: Once | INTRAMUSCULAR | Status: DC

## 2015-10-26 MED ORDER — CHLORDIAZEPOXIDE HCL 25 MG PO CAPS
25.0000 mg | ORAL_CAPSULE | Freq: Four times a day (QID) | ORAL | Status: DC | PRN
  Administered 2015-10-26: 25 mg via ORAL
  Filled 2015-10-26: qty 1

## 2015-10-26 MED ORDER — METHOCARBAMOL 500 MG PO TABS: 500.0000 mg | ORAL_TABLET | Freq: Three times a day (TID) | ORAL | Status: DC | PRN

## 2015-10-26 MED ORDER — CHLORDIAZEPOXIDE HCL 25 MG PO CAPS
25.0000 mg | ORAL_CAPSULE | Freq: Four times a day (QID) | ORAL | Status: DC
  Administered 2015-10-26 – 2015-10-27 (×5): 25 mg via ORAL
  Filled 2015-10-26 (×5): qty 1

## 2015-10-26 MED ORDER — ONDANSETRON 4 MG PO TBDP
4.0000 mg | ORAL_TABLET | Freq: Four times a day (QID) | ORAL | Status: DC | PRN
  Administered 2015-10-26: 4 mg via ORAL
  Filled 2015-10-26: qty 1

## 2015-10-26 MED ORDER — MAGNESIUM HYDROXIDE 400 MG/5ML PO SUSP: 30.0000 mL | Freq: Every day | ORAL | Status: DC | PRN

## 2015-10-26 MED ORDER — BACITRACIN-NEOMYCIN-POLYMYXIN 400-5-5000 EX OINT: TOPICAL_OINTMENT | Freq: Two times a day (BID) | CUTANEOUS | Status: DC | PRN

## 2015-10-26 MED ORDER — CHLORDIAZEPOXIDE HCL 25 MG PO CAPS: 25.0000 mg | ORAL_CAPSULE | Freq: Every day | ORAL | Status: DC

## 2015-10-26 MED ORDER — DICYCLOMINE HCL 20 MG PO TABS
20.0000 mg | ORAL_TABLET | Freq: Four times a day (QID) | ORAL | Status: DC | PRN
  Administered 2015-10-26: 20 mg via ORAL
  Filled 2015-10-26: qty 1

## 2015-10-26 MED ORDER — HYDROXYZINE HCL 25 MG PO TABS
25.0000 mg | ORAL_TABLET | Freq: Four times a day (QID) | ORAL | Status: DC | PRN
  Administered 2015-10-26: 25 mg via ORAL
  Filled 2015-10-26: qty 1

## 2015-10-26 MED ORDER — CLONIDINE HCL 0.1 MG PO TABS
0.1000 mg | ORAL_TABLET | Freq: Four times a day (QID) | ORAL | Status: DC
Start: 2015-10-26 — End: 2015-10-27
  Administered 2015-10-26 – 2015-10-27 (×5): 0.1 mg via ORAL
  Filled 2015-10-26 (×12): qty 1

## 2015-10-26 MED ORDER — CHLORDIAZEPOXIDE HCL 25 MG PO CAPS: 25.0000 mg | ORAL_CAPSULE | ORAL | Status: DC

## 2015-10-26 MED ORDER — ACETAMINOPHEN 325 MG PO TABS: 650.0000 mg | ORAL_TABLET | Freq: Four times a day (QID) | ORAL | Status: DC | PRN

## 2015-10-26 MED ORDER — LOPERAMIDE HCL 2 MG PO CAPS: 2.0000 mg | ORAL_CAPSULE | ORAL | Status: DC | PRN

## 2015-10-26 MED ORDER — ADULT MULTIVITAMIN W/MINERALS CH
1.0000 | ORAL_TABLET | Freq: Every day | ORAL | Status: DC
  Administered 2015-10-26 – 2015-10-27 (×2): 1 via ORAL
  Filled 2015-10-26 (×4): qty 1

## 2015-10-26 MED ORDER — TRAZODONE HCL 50 MG PO TABS
50.0000 mg | ORAL_TABLET | Freq: Every evening | ORAL | Status: DC | PRN
  Filled 2015-10-26 (×6): qty 1

## 2015-10-26 NOTE — [facility] Group Notes (Signed)
The focus of this group is to educate the patient on the purpose and policies of crisis stabilization and provide a format to answer questions about their admission.  The group details unit policies and expectations of patients while admitted.  Patient did not attend 0900 nurse education orientation group this morning.  Patient stayed in bed.   

## 2015-10-26 NOTE — Progress Notes (Signed)
Patient ID: Chloe Saunders, female   DOB: 09-08-1991, 24 y.o.   MRN: [id number]  Admission Note:   D:24 yr female who presents [facility] in no acute distress for the treatment of SA and ETOH. Pt appears flat and depressed. Pt was calm and cooperative with admission process. Pt denies SI/HI/AVH. Pt stated she started to drink very heavily since her cousin died in Aug 24 2015. Pt presented and appeared to still have ETOH in her system. Pt stated she has been homeless x 1 yr. Pt presented with the attitude that no one in her life has helped her and she stated she was tired of drinking and drugging and doing other things on the street.   A:Skin was assessed by female nurse and documented accordingly . PT searched and no contraband found, POC and unit policies explained and understanding verbalized. Consents obtained. Food and fluids offered, and  Accepted.  R: Pt had no additional questions or concerns.
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
## 2015-10-26 NOTE — Tx Team (Addendum)
Interdisciplinary Treatment Plan Update (Adult) Date: 10/26/2015    Time Reviewed: 9:30 AM  Progress in Treatment: Attending groups: Continuing to assess, patient new to milieu Participating in groups: Continuing to assess, patient new to milieu Taking medication as prescribed: Yes Tolerating medication: Yes Family/Significant other contact made: No, patient has attempted to contact friend Patient understands diagnosis: Yes Discussing patient identified problems/goals with staff: Yes Medical problems stabilized or resolved: Yes Denies suicidal/homicidal ideation: Yes Issues/concerns per patient self-inventory: Yes Other:  New problem(s) identified: N/A  Discharge Plan or Barriers: CSW continuing to assess, patient new to milieu. Patient plans to return to previous living situation and follow up with outpatient services.   Reason for Continuation of Hospitalization:  Depression Anxiety Medication Stabilization   Comments: N/A  Estimated length of stay: Discharge anticipated for today 12/14   Patient is a 24 year old female admitted for SI and polysubstance abuse. Patient reports that she is currently homeless. Patient will benefit from crisis stabilization, medication evaluation, group therapy, and psycho education in addition to case management for discharge planning. Patient and CSW reviewed pt's identified goals and treatment plan. Pt verbalized understanding and agreed to treatment plan.     Review of initial/current patient goals per problem list:  1. Goal(s): Patient will participate in aftercare plan   Met: Yes   Target date: 3-5 days post admission date   As evidenced by: Patient will participate within aftercare plan AEB aftercare provider and housing plan at discharge being identified.  12/13: Goal not met: CSW assessing for appropriate referrals for pt and will have follow up secured prior to d/c.  12/14: Goal met: Patient plans to return to previous  living situation and follow up with outpatient services.     2. Goal (s): Patient will exhibit decreased depressive symptoms and suicidal ideations.   Met: Yes   Target date: 3-5 days post admission date   As evidenced by: Patient will utilize self rating of depression at 3 or below and demonstrate decreased signs of depression or be deemed stable for discharge by MD.   12/13: Goal not met: Pt presents with flat affect and depressed mood.  Pt admitted with depression rating of 10.  Pt to show decreased sign of depression and a rating of 3 or less before d/c.    12/14: Goal met: Patient rates depression at 1 today, denies SI.    3. Goal(s): Patient will demonstrate decreased signs and symptoms of anxiety.   Met: Yes   Target date: 3-5 days post admission date   As evidenced by: Patient will utilize self rating of anxiety at 3 or below and demonstrated decreased signs of anxiety, or be deemed stable for discharge by MD  12/13: Goal not met: Pt presents with anxious mood and affect.  Pt admitted with anxiety rating of 10.  Pt to show decreased sign of anxiety and a rating of 3 or less before d/c.   12/14: Goal met. Patient rates anxiety at 1 today.    4. Goal(s): Patient will demonstrate decreased signs of withdrawal due to substance abuse   Met: Yes   Target date: 3-5 days post admission date   As evidenced by: Patient will produce a CIWA/COWS score of 0 and have stable vitals signs, and no symptoms of withdrawal   12/13: Goal not met: Pt continues to have withdrawal symptoms of anxiety, GI upset and resting pulse rate and a COWS score of a 4.  Pt to show decrease withdrawal symptoms  prior to d/c.  12/14: Goal met: No withdrawal symptoms reported at this time per medical chart.       Attendees: Patient:  10/26/2015 10:48 AM   Family:  10/26/2015 10:48 AM   Physician: Dr. Lugo, Dr. Cobos  10/26/2015 10:48 AM   Nursing: Marian Friedman, Beverly Knight,  Sara Twyman, RN 10/26/2015 10:48 AM   Clinical Social Worker: Heather Smart, LCSW 10/26/2015 10:48 AM   Clinical Social Worker: Lauren Carter, Kristin Drinkard LCSWA 10/26/2015 10:48 AM   Other: Jennifer C. Nurse Case Manager 10/26/2015 10:48 AM   Other:  10/26/2015 10:48 AM   Other: Aggie Nwoko, Conrad Withrow, NP 10/26/2015 10:48 AM   Other:  10/26/2015 10:48 AM          Scribe for Treatment Team:  Kristin Drinkard, MSW, LCSWA [phone]     

## 2015-10-26 NOTE — [facility] Group Notes (Signed)
[facility] LCSW Group Therapy 10/26/2015  1:15 PM   Type of Therapy: Group Therapy  Participation Level: Did Not Attend. Patient invited to participate but declined.   Lyndzie Zentz, MSW, LCSWA Clinical Social Worker [facility] [phone]   

## 2015-10-26 NOTE — Tx Team (Signed)
Initial Interdisciplinary Treatment Plan   PATIENT STRESSORS: Educational concerns Financial difficulties Loss of cousin Marital or family conflict Substance abuse   PATIENT STRENGTHS: Ability for insight Active sense of humor Communication skills General fund of knowledge Religious Affiliation Work skills   PROBLEM LIST: Problem List/Patient Goals Date to be addressed Date deferred Reason deferred Estimated date of resolution  ETOH 10/26/15     Grief 10/26/15     "getting life together" 10/26/15      SA 10/26/15     Housing 10/26/15                              DISCHARGE CRITERIA:  Improved stabilization in mood, thinking, and/or behavior Verbal commitment to aftercare and medication compliance  PRELIMINARY DISCHARGE PLAN: Attend aftercare/continuing care group Attend 12-step recovery group Outpatient therapy  PATIENT/FAMIILY INVOLVEMENT: This treatment plan has been presented to and reviewed with the patient, Chloe Saunders .  The patient and family have been given the opportunity to ask questions and make suggestions.  Jacques Navyhillips, Gaylen Venning A 10/26/2015, 2:41 AM

## 2015-10-26 NOTE — Plan of Care (Signed)
Problem: Consults Goal: Anxiety Disorder Patient Education See Patient Education Module for eduction specifics.  Outcome: Progressing Nurse discussed depression/anxiety/coping skills with patient.

## 2015-10-26 NOTE — Progress Notes (Signed)
Recreation Therapy Notes  Animal-Assisted Activity (AAA) Program Checklist/Progress Notes Patient Eligibility Criteria Checklist & Daily Group note for Rec Tx Intervention  Date: 12.13.2016  Time: 2:45pm Location: [address] Dayroom    AAA/T Program Assumption of Risk Form signed by Patient/ or Parent Legal Guardian yes  Patient is free of allergies or sever asthma yes  Patient reports no fear of animals yes  Patient reports no history of cruelty to animals yes  Patient understands his/her participation is voluntary yes  Behavioral Response: Did not attend.   Chloe Saunders, LRT/CTRS        Chloe Saunders, [name] 10/26/2015 3:15 PM 

## 2015-10-26 NOTE — [facility] Suicide Risk Assessment (Signed)
[facility]   Nursing information obtained from:    Demographic factors:    Current Mental Status:    Loss Factors:    Historical Factors:    Risk Reduction Factors:    Total Time spent with patient: 45 minutes Principal Problem: <principal problem not specified> Diagnosis:   Patient Active Problem List   Diagnosis Date Noted  . Substance induced mood disorder (HCC) [F19.94] 10/26/2015  . Alcohol dependence with acute alcoholic intoxication without complication (HCC) [F10.220] 10/26/2015  . Cocaine abuse with cocaine-induced mood disorder [facility](HCC) [F14.14] 10/26/2015     Continued Clinical Symptoms:  Alcohol Use Disorder Identification Test Final Score (AUDIT): 29 The "Alcohol Use Disorders Identification Test", Guidelines for Use in Primary Care, Second Edition.  World Science writerHealth Organization [facility](WHO). Score between 0-7:  no or low risk or alcohol related problems. Score between 8-15:  moderate risk of alcohol related problems. Score between 16-19:  high risk of alcohol related problems. Score 20 or above:  warrants further diagnostic evaluation for alcohol dependence and treatment.   CLINICAL FACTORS:   Depression:   Comorbid alcohol abuse/dependence Alcohol/Substance Abuse/Dependencies  Psychiatric Specialty Exam: Physical Exam  ROS  Blood pressure 100/52, pulse 76, temperature 98.7 F (37.1 C), temperature source Oral, resp. rate 16, height 5\' 1"  (1.549 m), weight 63.504 kg (140 lb), last menstrual period 09/25/2015.Body mass index is 26.47 kg/(m^2).    COGNITIVE FEATURES THAT CONTRIBUTE TO RISK:  Closed-mindedness, Polarized thinking and Thought constriction (tunnel vision)    SUICIDE RISK:   Moderate:  Frequent suicidal ideation with limited intensity, and duration, some specificity in terms of plans, no associated intent, good self-control, limited dysphoria/symptomatology, some risk factors present, and identifiable protective factors, including  available and accessible social support.  PLAN OF CARE: see admission H and P Medical Decision Making:  Review of Psycho-Social Stressors (1), Review or order clinical lab tests (1), Review of Medication Regimen & Side Effects (2) and Review of New Medication or Change in Dosage (2)  I certify that inpatient services furnished can reasonably be expected to improve the patient's condition.   Kedarius Aloisi A 10/26/2015, 5:19 PM

## 2015-10-26 NOTE — H&P (Signed)
Psychiatric Admission Assessment Adult  Patient Identification: Chloe Saunders MRN:  [id number] Date of Evaluation:  10/26/2015 Chief Complaint:  ALCOHOL INDUCED MOOD DISORDER Principal Diagnosis: <principal problem not specified> Diagnosis:   Patient Active Problem List   Diagnosis Date Noted  . Substance induced mood disorder [facility]) [F19.94] 10/26/2015   History of Present Illness:: 24 Y/O female who states that alcohol is affecting her. States she wants to get herself back together, quit drinking, and quit hanging out with the wrong people. Her cousin died 2023-09-24 the 01/11/2023 th and  her drinking went up. States she cant stop drinking. States if she is around it she cant not drink. States she was staying with her  grandmother and she put her out in 2014 because she was not able to give her money for rent. She had been staying with an older guy, who drinks. States she got a " little job" and was starting to get herself together. Then a friend started coming around the friend wanted to party. She continued to drink every day and still went to work. Eventually she lost her job, and her placement. States she is currently drinking and using cocaine "gallons" of alcohol. She admits she was feeling suicidal when she came to the unit The initial assessment was as follows: Chloe Saunders is an 24 y.o. female presenting to [facility] apparently intoxicated. Patient states that she drinks about a gallon of hard liquor per day and has been doing so since she was 16. Patient states that she is sad because she has lost her cousin and her grandmother. She is also sad because she has a bad relationship with her mother. Sts, "I need disability" as well. Writer asked if she is suicidal and she sts, "Hell yeah all the damn time". Patient feels overwhelmed with stress and reports suicidal ideations with plan(s). She has a plan to overdose or hang self with belt in the closet. She has access to pills. She is unable to contract for safety.  Patient has a prior history of suicide attempt by overdose. Patient reports depressive symptoms of hopelessness, despair, isolating self from others, and loss of interest in usual pleasures. She has bouts of anxiety. She reports poor sleep and appetite. Patient reports homicidal thoughts toward, "Chad". Sts, "I want to kill that bitch". Patient does not provide further information about "[name]". The relationship is unknown. Patient refused to describe reasoning for her HI toward "[name]". She denies history of harm to others. She has legal issues associated with trespassing. Patient has to go to court for the charge but doesn't know the date.  Associated Signs/Symptoms: Depression Symptoms:  depressed mood, anhedonia, insomnia, fatigue, hopelessness, suicidal thoughts without plan, anxiety, panic attacks, loss of energy/fatigue, disturbed sleep, decreased appetite, (Hypo) Manic Symptoms:  Irritable Mood, Labiality of Mood, Anxiety Symptoms:  Excessive Worry, Panic Symptoms, Psychotic Symptoms:  Hallucinations: Visual when she started drinking heavily Paranoia, PTSD Symptoms: Negative Total Time spent with patient: 45 minutes  Past Psychiatric History:   Risk to Self: Is patient at risk for suicide?: No Risk to Others:  No Prior Inpatient Therapy:  Denies Prior Outpatient Therapy:  Yes in 2005  [facility]  Alcohol Screening: 1. How often do you have a drink containing alcohol?: 4 or more times a week 2. How many drinks containing alcohol do you have on a typical day when you are drinking?: 10 or more 3. How often do you have six or more drinks on one occasion?: Daily or almost daily Preliminary  Score: 8 4. How often during the last year have you found that you were not able to stop drinking once you had started?: Weekly 5. How often during the last year have you failed to do what was normally expected from you becasue of drinking?: Weekly 6. How often during the last year have you  needed a first drink in the morning to get yourself going after a heavy drinking session?: Daily or almost daily 8. How often during the last year have you been unable to remember what happened the night before because you had been drinking?: Weekly 9. Have you or someone else been injured as a result of your drinking?: No 10. Has a relative or friend or a doctor or another health worker been concerned about your drinking or suggested you cut down?: Yes, during the last year Alcohol Use Disorder Identification Test Final Score (AUDIT): 29 Brief Intervention: Patient declined brief intervention Substance Abuse History in the last 12 months:  Yes.   Consequences of Substance Abuse: Legal Consequences:  one DWI Blackouts:   Withdrawal Symptoms:   Diaphoresis hot flashes might throw up Previous Psychotropic Medications: No  Psychological Evaluations: No  Past Medical History: History reviewed. No pertinent past medical history. History reviewed. No pertinent past surgical history. Family History: History reviewed. No pertinent family history. Family Psychiatric  History: cousin anxiety Xanax, mother alcohol depression father crack alcohol sister alcohol  Social History:  History  Alcohol Use  . Yes     History  Drug Use  . Yes  . Special: Cocaine, Marijuana    Comment: heroin    Social History   Social History  . Marital Status: Unknown    Spouse Name: N/A  . Number of Children: N/A  . Years of Education: N/A   Social History Main Topics  . Smoking status: Current Every Day Smoker -- 1.00 packs/day for 2 years    Types: Cigarettes  . Smokeless tobacco: None  . Alcohol Use: Yes  . Drug Use: Yes    Special: Cocaine, Marijuana     Comment: heroin  . Sexual Activity: Yes    Birth Control/ Protection: Condom   Other Topics Concern  . None   Social History Narrative  states she has 2 years of college. 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  Additional Social History:                         Allergies:  No Known Allergies Lab Results:  Results for orders placed or performed during the hospital encounter of 10/25/15 (from the past 48 hour(s))  Urine rapid drug screen (hosp performed) (Not at [facility])     Status: Abnormal   Collection Time: 10/25/15  4:01 PM  Result Value Ref Range   Opiates NONE DETECTED NONE DETECTED   Cocaine POSITIVE (A) NONE DETECTED   Benzodiazepines POSITIVE (A) NONE DETECTED   Amphetamines NONE DETECTED NONE DETECTED   Tetrahydrocannabinol NONE DETECTED NONE DETECTED   Barbiturates NONE DETECTED NONE DETECTED    Comment:        DRUG SCREEN FOR MEDICAL PURPOSES ONLY.  IF CONFIRMATION IS NEEDED FOR ANY PURPOSE, NOTIFY LAB WITHIN 5 DAYS.        LOWEST DETECTABLE LIMITS FOR URINE DRUG SCREEN Drug Class  Cutoff (ng/mL) Amphetamine      1000 Barbiturate      200 Benzodiazepine   378 Tricyclics       588 Opiates          300 Cocaine          300 THC              50   Comprehensive metabolic panel     Status: Abnormal   Collection Time: 10/25/15  4:03 PM  Result Value Ref Range   Sodium 141 135 - 145 mmol/L   Potassium 4.0 3.5 - 5.1 mmol/L   Chloride 106 101 - 111 mmol/L   CO2 20 (L) 22 - 32 mmol/L   Glucose, Bld 85 65 - 99 mg/dL   BUN 16 6 - 20 mg/dL   Creatinine, Ser 0.63 0.44 - 1.00 mg/dL   Calcium 9.0 8.9 - 10.3 mg/dL   Total Protein 8.1 6.5 - 8.1 g/dL   Albumin 4.8 3.5 - 5.0 g/dL   AST 66 (H) 15 - 41 U/L   ALT 45 14 - 54 U/L   Alkaline Phosphatase 56 38 - 126 U/L   Total Bilirubin 1.3 (H) 0.3 - 1.2 mg/dL   GFR calc non Af Amer >60 >60 mL/min   GFR calc Af Amer >60 >60 mL/min    Comment: (NOTE) The eGFR has been calculated using the CKD EPI equation. This calculation has not been validated in all clinical situations. eGFR's persistently <60 mL/min signify possible Chronic  Kidney Disease.    Anion gap 15 5 - 15  Ethanol (ETOH)     Status: Abnormal   Collection Time: 10/25/15  4:03 PM  Result Value Ref Range   Alcohol, Ethyl (B) 313 (HH) <5 mg/dL    Comment:        LOWEST DETECTABLE LIMIT FOR SERUM ALCOHOL IS 5 mg/dL FOR MEDICAL PURPOSES ONLY CRITICAL RESULT CALLED TO, READ BACK BY AND VERIFIED WITH: C.HALL AT 1650 ON 10/25/15 BY S.VANHOORNE   CBC     Status: None   Collection Time: 10/25/15  4:03 PM  Result Value Ref Range   WBC 8.0 4.0 - 10.5 K/uL   RBC 4.23 3.87 - 5.11 MIL/uL   Hemoglobin 12.4 12.0 - 15.0 g/dL   HCT 37.5 36.0 - 46.0 %   MCV 88.7 78.0 - 100.0 fL   MCH 29.3 26.0 - 34.0 pg   MCHC 33.1 30.0 - 36.0 g/dL   RDW 13.3 11.5 - 15.5 %   Platelets 213 150 - 400 K/uL  I-Stat Beta hCG blood, ED ([facility], [facility], [facility] only)     Status: None   Collection Time: 10/25/15  4:07 PM  Result Value Ref Range   I-stat hCG, quantitative <5.0 <5 mIU/mL   Comment 3            Comment:   GEST. AGE      CONC.  (mIU/mL)   <=1 WEEK        5 - 50     2 WEEKS       50 - 500     3 WEEKS       100 - 10,000     4 WEEKS     1,000 - 30,000        FEMALE AND NON-PREGNANT FEMALE:     LESS THAN 5 mIU/mL     Metabolic Disorder Labs:  No results found for: HGBA1C, MPG No results found for: PROLACTIN No results  found for: CHOL, TRIG, HDL, CHOLHDL, VLDL, LDLCALC  Current Medications: Current Facility-Administered Medications  Medication Dose Route Frequency Provider Last Rate Last Dose  . acetaminophen (TYLENOL) tablet 650 mg  650 mg Oral Q6H PRN Laverle Hobby, PA-C      . alum & mag hydroxide-simeth (MAALOX/MYLANTA) 200-200-20 MG/5ML suspension 30 mL  30 mL Oral Q4H PRN Laverle Hobby, PA-C      . chlordiazePOXIDE (LIBRIUM) capsule 25 mg  25 mg Oral Q6H PRN Laverle Hobby, PA-C   25 mg at 10/26/15 0211  . chlordiazePOXIDE (LIBRIUM) capsule 25 mg  25 mg Oral QID Laverle Hobby, PA-C   25 mg at 10/26/15 0809   Followed by  . [START ON 10/27/2015] chlordiazePOXIDE  (LIBRIUM) capsule 25 mg  25 mg Oral TID Laverle Hobby, PA-C       Followed by  . [START ON 10/28/2015] chlordiazePOXIDE (LIBRIUM) capsule 25 mg  25 mg Oral BH-qamhs Spencer E Simon, PA-C       Followed by  . [START ON 10/30/2015] chlordiazePOXIDE (LIBRIUM) capsule 25 mg  25 mg Oral Daily Laverle Hobby, PA-C      . cloNIDine (CATAPRES) tablet 0.1 mg  0.1 mg Oral QID Laverle Hobby, PA-C   0.1 mg at 10/26/15 0810   Followed by  . [START ON 10/28/2015] cloNIDine (CATAPRES) tablet 0.1 mg  0.1 mg Oral BH-qamhs Spencer E Simon, PA-C       Followed by  . [START ON 10/31/2015] cloNIDine (CATAPRES) tablet 0.1 mg  0.1 mg Oral QAC breakfast Laverle Hobby, PA-C      . dicyclomine (BENTYL) tablet 20 mg  20 mg Oral Q6H PRN Laverle Hobby, PA-C   20 mg at 10/26/15 0818  . hydrOXYzine (ATARAX/VISTARIL) tablet 25 mg  25 mg Oral Q6H PRN Laverle Hobby, PA-C   25 mg at 10/26/15 0211  . [START ON 10/27/2015] Influenza vac split quadrivalent PF (FLUARIX) injection 0.5 mL  0.5 mL Intramuscular Tomorrow-1000 Nicholaus Bloom, MD      . loperamide (IMODIUM) capsule 2-4 mg  2-4 mg Oral PRN Laverle Hobby, PA-C      . magnesium hydroxide (MILK OF MAGNESIA) suspension 30 mL  30 mL Oral Daily PRN Laverle Hobby, PA-C      . methocarbamol (ROBAXIN) tablet 500 mg  500 mg Oral Q8H PRN Laverle Hobby, PA-C      . multivitamin with minerals tablet 1 tablet  1 tablet Oral Daily Laverle Hobby, PA-C   1 tablet at 10/26/15 3817  . naproxen (NAPROSYN) tablet 500 mg  500 mg Oral BID PRN Laverle Hobby, PA-C   500 mg at 10/26/15 0825  . neomycin-bacitracin-polymyxin (NEOSPORIN) ointment   Topical BID PRN Laverle Hobby, PA-C      . ondansetron (ZOFRAN-ODT) disintegrating tablet 4 mg  4 mg Oral Q6H PRN Laverle Hobby, PA-C   4 mg at 10/26/15 7116  . [START ON 10/27/2015] pneumococcal 23 valent vaccine (PNU-IMMUNE) injection 0.5 mL  0.5 mL Intramuscular Tomorrow-1000 Nicholaus Bloom, MD      . thiamine (B-1) injection 100 mg   100 mg Intramuscular Once Laverle Hobby, PA-C   100 mg at 10/26/15 0208  . [START ON 10/27/2015] thiamine (VITAMIN B-1) tablet 100 mg  100 mg Oral Daily Laverle Hobby, PA-C      . traZODone (DESYREL) tablet 50 mg  50 mg Oral QHS,MR X 1 Laverle Hobby,  PA-C       PTA Medications: Prescriptions prior to admission  Medication Sig Dispense Refill Last Dose  . naphazoline-pheniramine (NAPHCON-A) 0.025-0.3 % ophthalmic solution Place 1 drop into both eyes 4 (four) times daily as needed for irritation.   Past Month at Unknown time    Musculoskeletal: Strength & Muscle Tone: within normal limits Gait & Station: normal Patient leans: normal  Psychiatric Specialty Exam: Physical Exam  Review of Systems  Constitutional: Positive for malaise/fatigue.  HENT: Negative.   Eyes: Negative.   Respiratory: Positive for cough.        Whole pack a day  Cardiovascular: Negative.   Gastrointestinal: Positive for nausea.  Genitourinary: Negative.   Musculoskeletal: Negative.   Skin: Positive for rash.  Neurological: Positive for dizziness.  Endo/Heme/Allergies: Negative.   Psychiatric/Behavioral: Positive for depression, suicidal ideas and substance abuse. The patient is nervous/anxious and has insomnia.     Blood pressure 130/65, pulse 110, temperature 98.7 F (37.1 C), temperature source Oral, resp. rate 16, height 5' 1"  (1.549 m), weight 63.504 kg (140 lb), last menstrual period 09/25/2015.Body mass index is 26.47 kg/(m^2).  General Appearance: Fairly Groomed  Engineer, water::  Fair  Speech:  Clear and Coherent  Volume:  Normal  Mood:  Anxious and Dysphoric  Affect:  Restricted  Thought Process:  Coherent and Goal Directed  Orientation:  Full (Time, Place, and Person)  Thought Content:  symptoms events worries concerns  Suicidal Thoughts:  Yes.  without intent/plan  Homicidal Thoughts:  No  Memory:  Immediate;   Fair Recent;   Fair Remote;   Fair  Judgement:  Fair  Insight:  Present  and Shallow  Psychomotor Activity:  Restlessness  Concentration:  Fair  Recall:  [employer] of Knowledge:Fair  Language: Fair  Akathisia:  No  Handed:  Right  AIMS (if indicated):     Assets:  Desire for Improvement  ADL's:  Intact  Cognition: WNL  Sleep:  Number of Hours: 3.5     Treatment Plan Summary: Daily contact with patient to assess and evaluate symptoms and progress in treatment and Medication management Supportive approach/coping skills Alcohol dependence; Librium detox protocol/work a relapse prevention plan Cocaine dependence: monitor for mood instability from coming off the cocaine Depression; reassess for the need for an antidepressant Work with CBT/mindfulness Explore residential treatment options  Observation Level/Precautions:  15 minute checks  Laboratory:  As per the ED  Psychotherapy:  Individual/group  Medications:  Will detox with librium will reassess for the need for other psychotropic agents  Consultations:    Discharge Concerns:  Need for a residential treatment facility  Estimated LOS: 3-5 days  Other:     I certify that inpatient services furnished can reasonably be expected to improve the patient's condition.   Jaleil Renwick A 12/13/201611:05 AM

## 2015-10-26 NOTE — Progress Notes (Signed)
D:  Patient's self inventory sheet, patient has fair sleep, sleep medication is helpful.  Poor appetite, normal energy level, poor concentration.  Rated depression, hopeless and anxiety 5.  Denied withdrawals.  Denied SI.  Physical problems, rash, worst pain in past 24 hours #5.  Goal is to figure out how to live for [place].  Plans to pray and make it to different meetings.  "Body need check on."  No discharge plans.  Needs to make phone calls. A:  Medications administered per MD orders.  Emotional support and encouragement given patient. R:  Denied SI and HI.  Stated she does see visions of dead people.  Denied voices.  Contracts for safety.  Safety maintained with 15 minute checks.

## 2015-10-26 NOTE — Progress Notes (Signed)
Did not attended group 

## 2015-10-27 NOTE — Progress Notes (Signed)
Discharge note: Pt discharged per MD order. Discharge summary reviewed with pt. Pt eager to be discharged. Pt verbalizes  and signs understanding of discharge summary. Pt denies SI/HI, Denies AVH. Denies s/s of withdrawal. All personal items returned to pt from locker #28. Pt verbalizes and signs she received all personal items. Ambulatory out of facility.

## 2015-10-27 NOTE — [facility] Suicide Risk Assessment (Signed)
[facility] INPATIENT:  Family/Significant Other Suicide Prevention Education  Suicide Prevention Education:  Contact Attempts: friend Chloe Saunders [phone], (name of family member/significant other) has been identified by the patient as the family member/significant other with whom the patient will be residing, and identified as the person(s) who will aid the patient in the event of a mental health crisis.  With written consent from the patient, two attempts were made to provide suicide prevention education, prior to and/or following the patient's discharge.  We were unsuccessful in providing suicide prevention education.  A suicide education pamphlet was given to the patient to share with family/significant other.  Date and time of first attempt: 10/27/15 at 10:20am Date and time of second attempt:10/27/15 at 12pm  Chloe Saunders, Chloe Saunders 10/27/2015, 10:18 AM

## 2015-10-27 NOTE — Progress Notes (Signed)
Recreation Therapy Notes  Date: 12.14.2016  Time: 9:30am Location: [address] Group Room   Group Topic: Stress Management  Goal Area(s) Addresses:  Patient will actively participate in stress management techniques presented during session.   Behavioral Response: Appropriate   Intervention: Stress management techniques  Activity :  Deep Breathing and Guided Imagery. LRT provided instruction and demonstration on practice of Guided Imagery. Technique was coupled with deep breathing.   Education:  Stress Management, Discharge Planning.   Education Outcome: Acknowledges education  Clinical Observations/Feedback: Patient actively engaged in technique introduced, expressed no concerns and demonstrated ability to practice independently post d/c.   Marykay Lexenise L Ahaan Zobrist, LRT/CTRS   Kristi Hyer, Brycelynn L 10/27/2015 2:14 PM

## 2015-10-27 NOTE — [facility] Suicide Risk Assessment (Signed)
[facility]   Demographic Factors:  Adolescent or young adult  Total Time spent with patient: 20 minutes  Musculoskeletal: Strength & Muscle Tone: within normal limits Gait & Station: normal Patient leans: normal  Psychiatric Specialty Exam: Physical Exam  Review of Systems  Constitutional: Negative.   HENT: Negative.   Eyes: Negative.   Respiratory: Negative.   Cardiovascular: Negative.   Gastrointestinal: Negative.   Genitourinary: Negative.   Musculoskeletal: Negative.   Skin: Negative.   Neurological: Negative.   Endo/Heme/Allergies: Negative.   Psychiatric/Behavioral: Positive for substance abuse.    Blood pressure 110/68, pulse 72, temperature 98.1 F (36.7 C), temperature source Oral, resp. rate 16, height 5\' 1"  (1.549 m), weight 63.504 kg (140 lb), last menstrual period 09/25/2015.Body mass index is 26.47 kg/(m^2).  General Appearance: Fairly Groomed  Patent attorneyye Contact::  Fair  Speech:  Clear and Coherent409  Volume:  Normal  Mood:  Euthymic  Affect:  Appropriate  Thought Process:  Coherent and Goal Directed  Orientation:  Full (Time, Place, and Person)  Thought Content:  plans as she moves on, relapse prevention plan  Suicidal Thoughts:  No  Homicidal Thoughts:  No  Memory:  Immediate;   Fair Recent;   Fair Remote;   Fair  Judgement:  Fair  Insight:  Present  Psychomotor Activity:  Normal  Concentration:  Fair  Recall:  [employer]  Fund of Knowledge:Fair  Language: Fair  Akathisia:  No  Handed:  Right  AIMS (if indicated):     Assets:  Desire for Improvement  Sleep:  Number of Hours: 5  Cognition: WNL  ADL's:  Intact   Have you used any form of tobacco in the last 30 days? (Cigarettes, Smokeless Tobacco, Cigars, and/or Pipes): Yes  Has this patient used any form of tobacco in the last 30 days? (Cigarettes, Smokeless Tobacco, Cigars, and/or Pipes) Yes, A prescription for an FDA-approved tobacco cessation medication was offered at  discharge and the patient refused  Mental Status Per Nursing Assessment::   On Admission:     Current Mental Status by Physician: In full contact with reality. There are no active S/S of withdrawal. There are no active SI plans or intent. She states she is ready to go home. Denies any withdrawal or feeling anxious, depressed. States she just needed a little help but that she can take it from here   Loss Factors: NA  Historical Factors: NA  Risk Reduction Factors:   Sense of responsibility to family and Living with another person, especially a relative  Continued Clinical Symptoms:  Alcohol/Substance Abuse/Dependencies  Cognitive Features That Contribute To Risk:  Closed-mindedness, Polarized thinking and Thought constriction (tunnel vision)    Suicide Risk:  Minimal: No identifiable suicidal ideation.  Patients presenting with no risk factors but with morbid ruminations; may be classified as minimal risk based on the severity of the depressive symptoms  Principal Problem: <principal problem not specified> Discharge Diagnoses:  Patient Active Problem List   Diagnosis Date Noted  . Substance induced mood disorder (HCC) [F19.94] 10/26/2015  . Alcohol dependence with acute alcoholic intoxication without complication (HCC) [F10.220] 10/26/2015  . Cocaine abuse with cocaine-induced mood disorder [facility](HCC) [F14.14] 10/26/2015    Follow-up Information    Follow up with [facility] On 10/29/2015.   Why:  Assessment for therapy and medication management services. Call office if you need to reschedule.    Contact information:   [address]  [phone] Fax: [phone]  Plan Of Care/Follow-up recommendations:  Activity:  as tolerated Diet:  regular Follow up as above Is patient on multiple antipsychotic therapies at discharge:  No   Has Patient had three or more failed trials of antipsychotic monotherapy by history:  No  Recommended Plan for Multiple  Antipsychotic Therapies: NA    Nastashia Gallo A 10/27/2015, 1:15 PM

## 2015-10-27 NOTE — Progress Notes (Signed)
D    Pt has spent the entire shift in bed   She refused her bedtime medications and had very minimal response to questions    She has had no complaints so far this shift A    Verbal support given    Medications offered    Q 15 min checks R   Pt safe at present

## 2015-10-27 NOTE — [facility] Group Notes (Signed)
   [facility]  10/27/2015  8:45 AM   Participation Quality: Alert, Appropriate and Oriented  Mood/Affect: Appropriate  Depression Rating: 1  Anxiety Rating: 1  Thoughts of Suicide: Pt denies SI/HI  Will you contract for safety? Yes  Current AVH: Pt denies  Plan for Discharge/Comments: Pt attended discharge planning group and actively participated in group. CSW provided pt with today's workbook. Patient reports feeling safe to discharge today. She is hopeful that she can return to previous living situation and is unsure of how she will get there.   Transportation Means: CSW assessing.  Supports: No supports mentioned at this time  Samuella BruinKristin Zacchaeus Halm, MSW, [employer] IncLCSWA Clinical Social Worker [facility] [phone]

## 2015-10-27 NOTE — Progress Notes (Signed)
  [facility] :  Will you be returning to the same living situation after discharge:  Yes,  patient plans to return to previous living situation At discharge, do you have transportation home?: Yes,  patient will be provided with bus passes Do you have the ability to pay for your medications: Yes,  patient will be provided with prescriptions at discharge  Release of information consent forms completed and in the chart;  Patient's signature needed at discharge.  Patient to Follow up at: Follow-up Information    Follow up with [facility] On 10/29/2015.   Why:  Assessment for therapy and medication management services. Call office if you need to reschedule.    Contact information:   [address] [place]  [phone] Fax: [phone]      Next level of care provider has access to [facility] Link:no  Patient denies SI/HI: Yes,  denies    Safety Planning and Suicide Prevention discussed: Yes, with patient  Have you used any form of tobacco in the last 30 days? (Cigarettes, Smokeless Tobacco, Cigars, and/or Pipes): Yes  Has patient been referred to the Quitline?: Patient refused referral  Patient has been referred for addiction treatment: Yes- outpatient  Marizol Borror, [place] CarboKristin L 10/27/2015, 12:01 PM

## 2015-10-27 NOTE — Progress Notes (Signed)
D:Per patient self inventory form pt reports she slept good last night with the use of sleep medication. Pt reports a good appetite, normal energy level, good concentration. She rates depression 0/10, hopelessness 0/10, anxiety 0/10- all on 0-10 scale, 10 being the worse. Pt denies SI/HI. Denies AVH. Denies physical pain. Denies s/s of withdrawal. Pt reports her goal is "getting my career started as many different choices as possible." Pt reports she will "go back to school" to meet her goal. Pt eager to be discharged.  A: Special checks q 15 mins in place for safety. Medication administered per MD order(see eMAR) Encouragement and support provided. CIWA protocol in place. Discharge planning in place.  R:safety maintained. Will continue to monitor.

## 2015-10-27 NOTE — Discharge Summary (Signed)
Physician Discharge Summary Note  Patient:  Chloe Saunders is an 24 y.o., female MRN:  [id number] DOB:  1991/08/06 Patient phone:  [phone] (home)  Patient address:   [address]  [place] [address],  Total Time spent with patient: Greater than  minutes  Date of Admission:  10/26/2015  Date of Discharge: 10-27-15  Reason for Admission: Alcohol intoxication  Principal Problem: <principal problem not specified> Discharge Diagnoses: Patient Active Problem List   Diagnosis Date Noted  . Substance induced mood disorder (HCC) [F19.94] 10/26/2015  . Alcohol dependence with acute alcoholic intoxication without complication (HCC) [F10.220] 10/26/2015  . Cocaine abuse with cocaine-induced mood disorder [facility]) [F14.14] 10/26/2015   Past Psychiatric History: Hx. Polysubstance depnedence  Past Medical History: History reviewed. No pertinent past medical history. History reviewed. No pertinent past surgical history.  Family History: History reviewed. No pertinent family history.  Family Psychiatric  History: See H&P  Social History:  History  Alcohol Use  . Yes     History  Drug Use  . Yes  . Special: Cocaine, Marijuana    Comment: heroin    Social History   Social History  . Marital Status: Unknown    Spouse Name: N/A  . Number of Children: N/A  . Years of Education: N/A   Social History Main Topics  . Smoking status: Current Every Day Smoker -- 1.00 packs/day for 2 years    Types: Cigarettes  . Smokeless tobacco: None  . Alcohol Use: Yes  . Drug Use: Yes    Special: Cocaine, Marijuana     Comment: heroin  . Sexual Activity: Yes    Birth Control/ Protection: Condom   Other Topics Concern  . None   Social History Narrative   Hospital Course: 24 Y/O female who states that alcohol is affecting her. States she wants to get herself back together, quit drinking, and quit hanging out with the wrong people. Her cousin died 09-01-2023 the 2023-04-11 th and her drinking went up.  States she cant stop drinking. States if she is around it she cant not drink. States she was staying with her grandmother and she put her out in 2014 because she was not able to give her money for rent. She had been staying with an older guy, who drinks. States she got a " little job" and was starting to get herself together. Then a friend started coming around the friend wanted to party. She continued to drink every day and still went to work. Eventually she lost her job, and her placement. States she is currently drinking and using cocaine "gallons" of alcohol. She admits she was feeling suicidal when she came to the unit.   Chloe Saunders's stay in this hospital was rather very brief. She came in to the hospital with her toxicoloy test result showing a BAL of 331 & UDS positive for Benzodiazepine & cocaine. She admits having been drinking a lot & using drugs. She wanted to stop drinking/using & get her life back together. She was in need of substance detoxification treatments.  After admission assessment, Chloe Saunders's symptoms were evaluated & noted. Medication regimen targeting those symptoms were initiated. She was enrolled in the group counseling sessions to learn coping skills that should help her after discharge to cope better & effectively to maintain mood stability/sobriety. She presented no other significant pre-existing medical issues that required treatment & or monitoring.   Chloe Saunders was just started on substance detoxification protocols & Trazodone 50 mg for insomnia. However, she came  to her provider this am & asked to be discharged. She states that she is feeling well & not having any substance withdrawal symptoms. Assessment reveals that she is in full contact with reality. There are no active S/S of withdrawal. There are no active SIHI plans or intent. She states she is ready to go home. Denies any withdrawal symptoms or feeling anxious or depressed. States she just needed a little help but that she can  take it from here.  Chloe Saunders is discharged without any prescriptions. She is to follow-up care as noted below. She left [facility] in no apparent distress with all personal belongings via bus transport. [facility] assisted with bus fare.  Physical Findings: AIMS: Facial and Oral Movements Muscles of Facial Expression: None, normal Lips and Perioral Area: None, normal Jaw: None, normal Tongue: None, normal,Extremity Movements Upper (arms, wrists, hands, fingers): None, normal Lower (legs, knees, ankles, toes): None, normal, Trunk Movements Neck, shoulders, hips: None, normal, Overall Severity Severity of abnormal movements (highest score from questions above): None, normal Incapacitation due to abnormal movements: None, normal Patient's awareness of abnormal movements (rate only patient's report): No Awareness, Dental Status Current problems with teeth and/or dentures?: No Does patient usually wear dentures?: No  CIWA:  CIWA-Ar Total: 0 COWS:  COWS Total Score: 0  Musculoskeletal: Strength & Muscle Tone: within normal limits Gait & Station: normal Patient leans: N/A  Psychiatric Specialty Exam: Review of Systems  Constitutional: Negative.   HENT: Negative.   Eyes: Negative.   Respiratory: Negative.   Cardiovascular: Negative.   Gastrointestinal: Negative.   Genitourinary: Negative.   Musculoskeletal: Negative.   Skin: Negative.   Neurological: Negative.   Endo/Heme/Allergies: Negative.   Psychiatric/Behavioral: Positive for substance abuse (Hx. Alcohol & cocaine dependence). Negative for depression, suicidal ideas, hallucinations and memory loss. The patient is not nervous/anxious and does not have insomnia.     Blood pressure 110/68, pulse 72, temperature 98.1 F (36.7 C), temperature source Oral, resp. rate 16, height  (1.549 m), weight 63.504 kg (140 lb), last menstrual period 09/25/2015.Body mass index is 26.47 kg/(m^2).  See Md's SRA   Have you used any form of tobacco in the  last 30 days? (Cigarettes, Smokeless Tobacco, Cigars, and/or Pipes): Yes  Has this patient used any form of tobacco in the last 30 days? (Cigarettes, Smokeless Tobacco, Cigars, and/or Pipes) Yes, Yes, A prescription for an FDA-approved tobacco cessation medication was offered at discharge and the patient refused  Metabolic Disorder Labs:  No results found for: HGBA1C, MPG No results found for: PROLACTIN No results found for: CHOL, TRIG, HDL, CHOLHDL, VLDL, LDLCALC  See Psychiatric Specialty Exam and Suicide Risk Assessment completed by Attending Physician prior to discharge.  Discharge destination:  Home  Is patient on multiple antipsychotic therapies at discharge:  No   Has Patient had three or more failed trials of antipsychotic monotherapy by history:  No  Recommended Plan for Multiple Antipsychotic Therapies: NA    Medication List    STOP taking these medications        naphazoline-pheniramine 0.025-0.3 % ophthalmic solution  Commonly known as:  NAPHCON-A       Follow-up Information    [facility] with [facility] 10/29/2015.   Why:  Assessment for therapy and medication management services. Call office if you need to reschedule.    Contact information:   [address] [place]  [phone] Fax: [phone]     Follow-up recommendations: Activity:  As tolerated Diet: As recommended  by your primary care doctor. Keep all scheduled follow-up appointments as recommended.   Comments: Take all your medications as prescribed by your mental healthcare provider. Report any adverse effects and or reactions from your medicines to your outpatient provider promptly. Patient is instructed and cautioned to not engage in alcohol and or illegal drug use while on prescription medicines. In the event of worsening symptoms, patient is instructed to call the crisis hotline, 911 and or go to the nearest ED for appropriate evaluation and treatment of symptoms. Follow-up with your  primary care provider for your other medical issues, concerns and or health care needs.   Signed: Sanjuana KavaNwoko, Agnes I, PMHNP, FNP-BC 10/27/2015, 1:22 PM  I personally assessed the patient and formulated the plan Madie RenoIrving A. Dub MikesLugo, M.D.

## 2016-02-01 ENCOUNTER — Emergency Department (HOSPITAL_COMMUNITY)
Admission: EM | Admit: 2016-02-01 | Discharge: 2016-02-02 | Disposition: A | Discharge status: Home / Self Care | Payer: Medicaid Other | Attending: Emergency Medicine | Admitting: Emergency Medicine

## 2016-02-01 ENCOUNTER — Encounter (HOSPITAL_COMMUNITY): Payer: Self-pay

## 2016-02-01 DIAGNOSIS — Z3202 Encounter for pregnancy test, result negative: Secondary | ICD-10-CM | POA: Insufficient documentation

## 2016-02-01 DIAGNOSIS — Y9289 Other specified places as the place of occurrence of the external cause: Secondary | ICD-10-CM | POA: Insufficient documentation

## 2016-02-01 DIAGNOSIS — F17210 Nicotine dependence, cigarettes, uncomplicated: Secondary | ICD-10-CM | POA: Insufficient documentation

## 2016-02-01 DIAGNOSIS — T7421XA Adult sexual abuse, confirmed, initial encounter: Secondary | ICD-10-CM | POA: Insufficient documentation

## 2016-02-01 DIAGNOSIS — Y9389 Activity, other specified: Secondary | ICD-10-CM | POA: Insufficient documentation

## 2016-02-01 DIAGNOSIS — Y998 Other external cause status: Secondary | ICD-10-CM | POA: Insufficient documentation

## 2016-02-01 MED ORDER — CEFTRIAXONE SODIUM 250 MG IJ SOLR
250.0000 mg | Freq: Once | INTRAMUSCULAR | Status: AC
  Administered 2016-02-01: 250 mg via INTRAMUSCULAR
  Filled 2016-02-01: qty 250

## 2016-02-01 MED ORDER — AZITHROMYCIN 250 MG PO TABS
1000.0000 mg | ORAL_TABLET | Freq: Once | ORAL | Status: AC
  Administered 2016-02-01: 1000 mg via ORAL
  Filled 2016-02-01: qty 4
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
## 2016-02-01 NOTE — ED Notes (Signed)
See EDP assessment 

## 2016-02-01 NOTE — ED Notes (Signed)
Called lab to add on serum preg

## 2016-02-01 NOTE — ED Notes (Signed)
Pt has not changed clothes.

## 2016-02-01 NOTE — ED Notes (Signed)
[employer] at triage room
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
## 2016-02-02 LAB — WET PREP, GENITAL
Sperm: NONE SEEN
TRICH WET PREP: NONE SEEN
WBC, Wet Prep HPF POC: NONE SEEN
YEAST WET PREP: NONE SEEN

## 2016-02-02 LAB — RPR: RPR Ser Ql: NONREACTIVE

## 2016-02-02 LAB — HCG, SERUM, QUALITATIVE: Preg, Serum: NEGATIVE

## 2016-02-02 LAB — GC/CHLAMYDIA PROBE AMP (~~LOC~~) NOT AT ARMC
CHLAMYDIA, DNA PROBE: NEGATIVE
Neisseria Gonorrhea: NEGATIVE

## 2016-02-02 LAB — HIV ANTIBODY (ROUTINE TESTING W REFLEX): HIV SCREEN 4TH GENERATION: NONREACTIVE
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
## 2016-04-26 IMAGING — CR DG CHEST 2V
2 series · 2 of 2 positions shown · non-contrast
Comparison: 09/05/2014

CLINICAL DATA: Productive cough for the last 3 or 4 weeks.

EXAM:
CHEST  2 VIEW

[w chest pa]
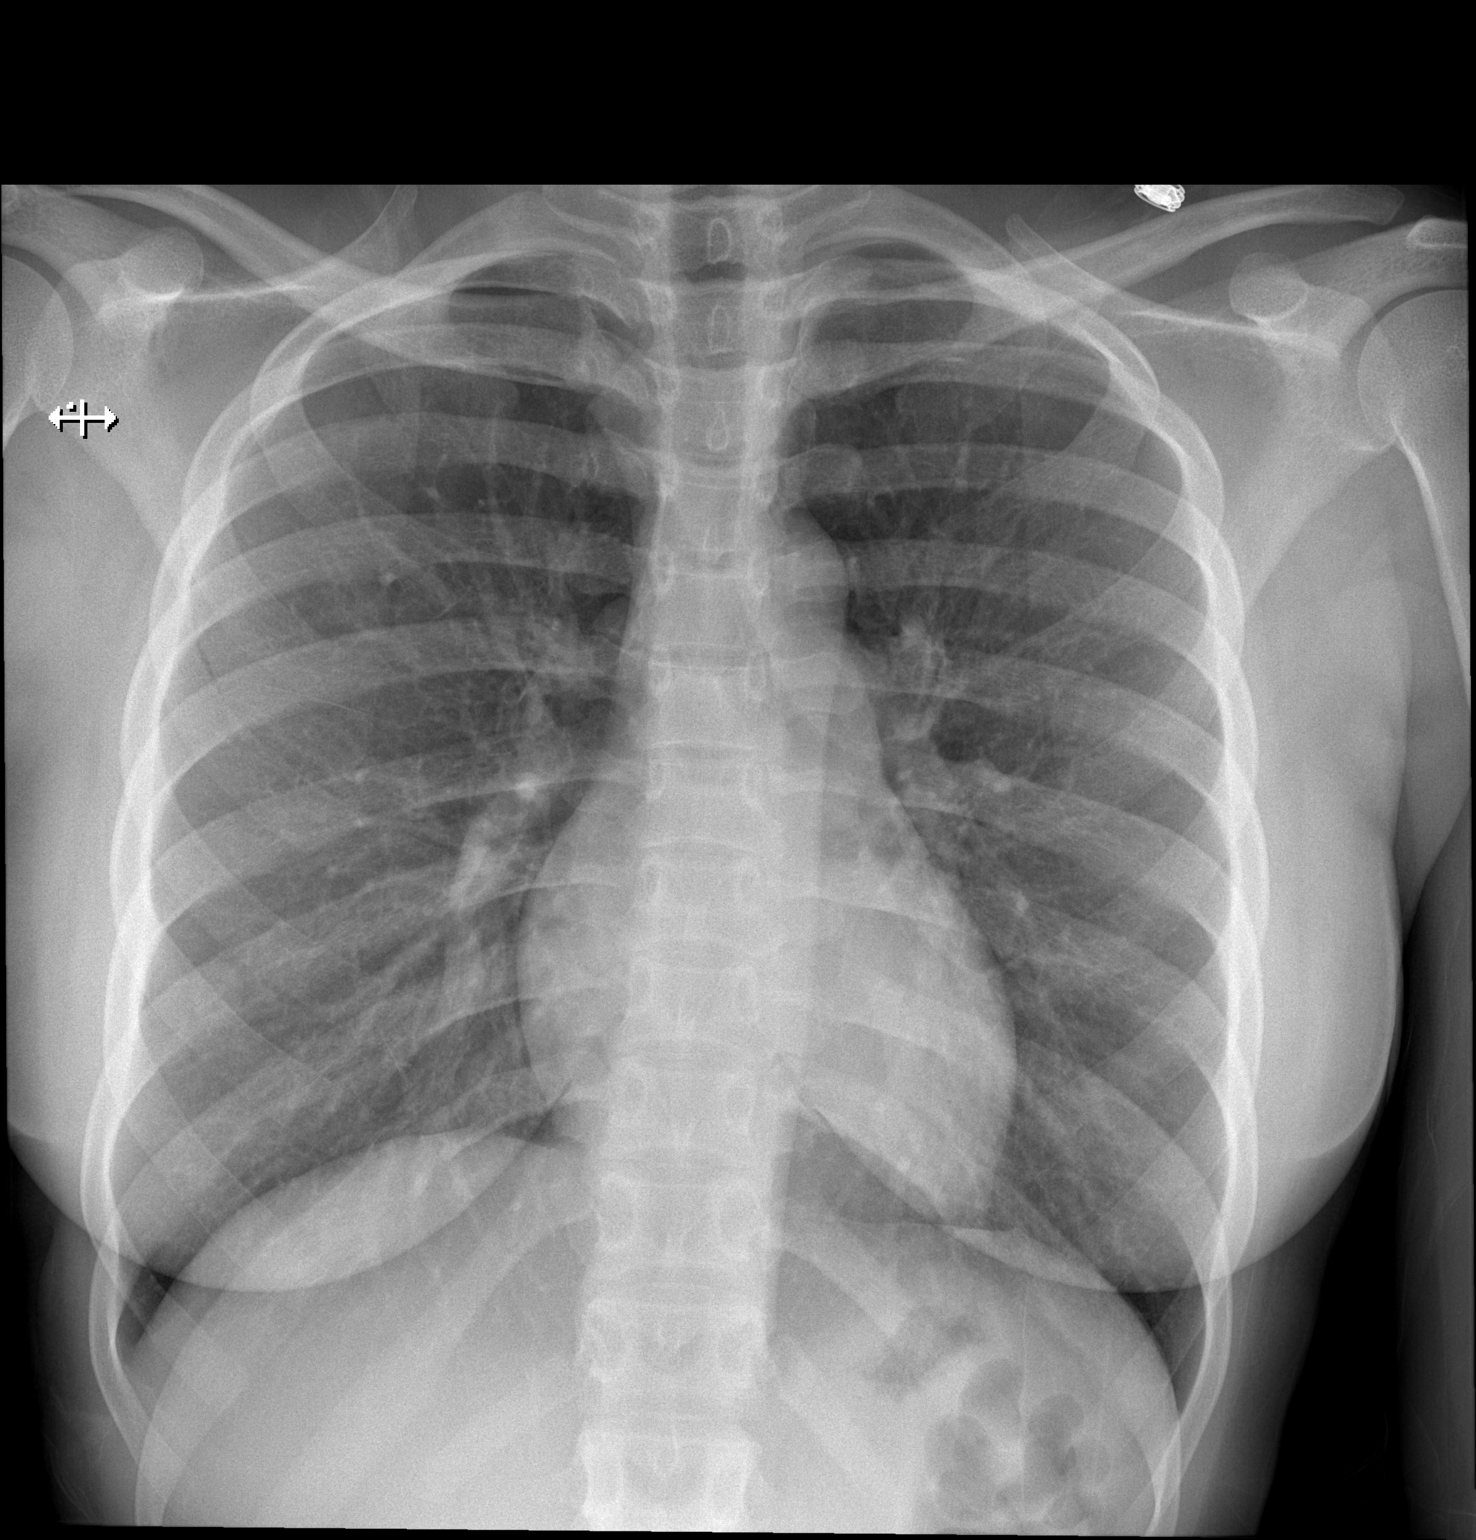

[w chest lat]
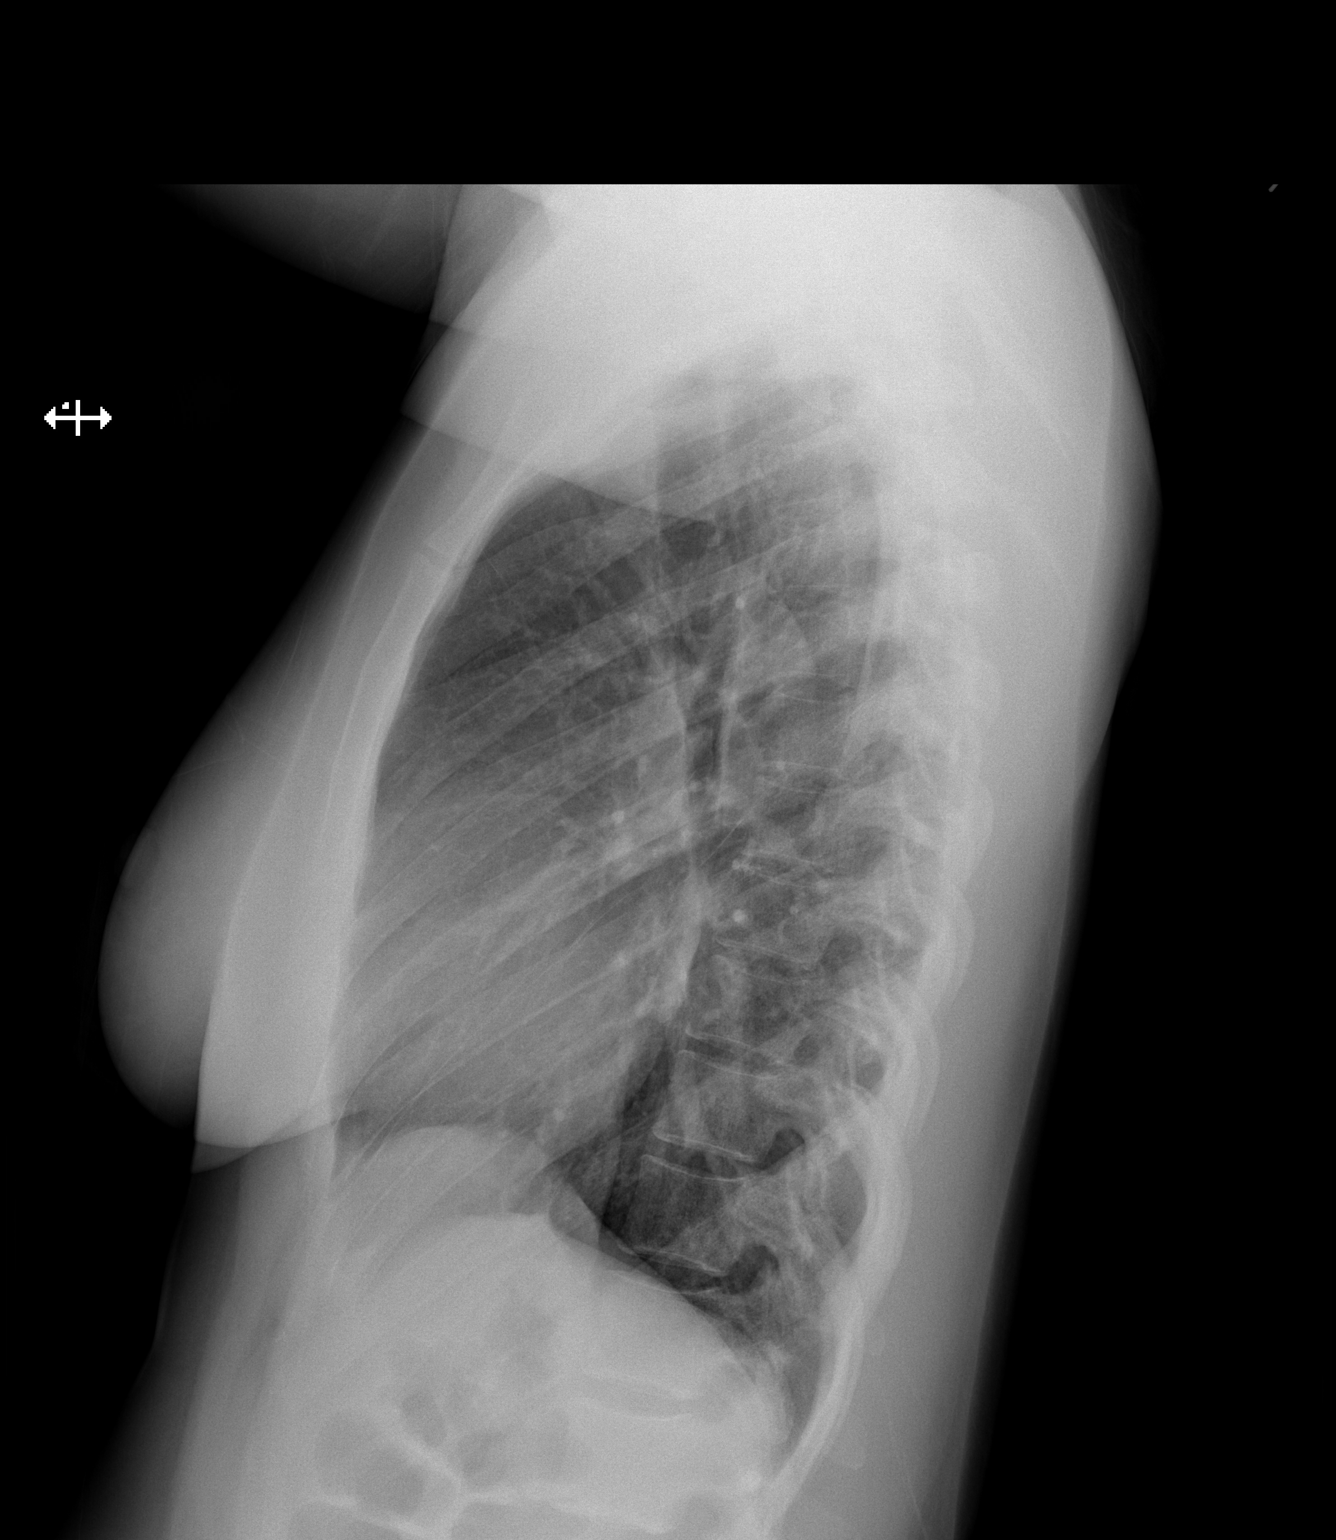

[2 of 2 positions shown; findings below may reference images not displayed]

FINDINGS: The heart size and mediastinal contours are within normal limits.
Both lungs are clear. The visualized skeletal structures are
unremarkable.
IMPRESSION: No active cardiopulmonary disease.

## 2016-12-28 ENCOUNTER — Inpatient Hospital Stay (HOSPITAL_COMMUNITY)
Admission: AD | Admit: 2016-12-28 | Discharge: 2016-12-28 | Disposition: A | Discharge status: Home / Self Care | Payer: Medicaid Other | Source: Ambulatory Visit | Attending: Obstetrics & Gynecology | Admitting: Obstetrics & Gynecology

## 2016-12-28 ENCOUNTER — Encounter (HOSPITAL_COMMUNITY): Payer: Self-pay | Admitting: *Deleted

## 2016-12-28 DIAGNOSIS — O26893 Other specified pregnancy related conditions, third trimester: Secondary | ICD-10-CM | POA: Insufficient documentation

## 2016-12-28 DIAGNOSIS — O99333 Smoking (tobacco) complicating pregnancy, third trimester: Secondary | ICD-10-CM | POA: Insufficient documentation

## 2016-12-28 DIAGNOSIS — O9989 Other specified diseases and conditions complicating pregnancy, childbirth and the puerperium: Secondary | ICD-10-CM | POA: Diagnosis not present

## 2016-12-28 DIAGNOSIS — N859 Noninflammatory disorder of uterus, unspecified: Secondary | ICD-10-CM | POA: Insufficient documentation

## 2016-12-28 DIAGNOSIS — O0933 Supervision of pregnancy with insufficient antenatal care, third trimester: Secondary | ICD-10-CM | POA: Diagnosis not present

## 2016-12-28 DIAGNOSIS — Z3A31 31 weeks gestation of pregnancy: Secondary | ICD-10-CM | POA: Insufficient documentation

## 2016-12-28 DIAGNOSIS — F17210 Nicotine dependence, cigarettes, uncomplicated: Secondary | ICD-10-CM | POA: Diagnosis not present

## 2016-12-28 DIAGNOSIS — O98313 Other infections with a predominantly sexual mode of transmission complicating pregnancy, third trimester: Secondary | ICD-10-CM | POA: Insufficient documentation

## 2016-12-28 DIAGNOSIS — A599 Trichomoniasis, unspecified: Secondary | ICD-10-CM | POA: Diagnosis not present

## 2016-12-28 DIAGNOSIS — N858 Other specified noninflammatory disorders of uterus: Secondary | ICD-10-CM

## 2016-12-28 DIAGNOSIS — N912 Amenorrhea, unspecified: Secondary | ICD-10-CM | POA: Diagnosis present

## 2016-12-28 LAB — URINALYSIS, ROUTINE W REFLEX MICROSCOPIC
Bilirubin Urine: NEGATIVE
Glucose, UA: >=500 mg/dL — AB
Hgb urine dipstick: NEGATIVE
KETONES UR: NEGATIVE mg/dL
Nitrite: NEGATIVE
PH: 6 (ref 5.0–8.0)
PROTEIN: NEGATIVE mg/dL
Specific Gravity, Urine: 1.013 (ref 1.005–1.030)

## 2016-12-28 LAB — RAPID URINE DRUG SCREEN, HOSP PERFORMED
Amphetamines: NOT DETECTED
BENZODIAZEPINES: NOT DETECTED
Barbiturates: NOT DETECTED
COCAINE: NOT DETECTED
Opiates: NOT DETECTED
Tetrahydrocannabinol: NOT DETECTED

## 2016-12-28 LAB — WET PREP, GENITAL
Clue Cells Wet Prep HPF POC: NONE SEEN
SPERM: NONE SEEN
YEAST WET PREP: NONE SEEN

## 2016-12-28 LAB — POCT PREGNANCY, URINE: Preg Test, Ur: POSITIVE — AB

## 2016-12-28 MED ORDER — METRONIDAZOLE 500 MG PO TABS
2000.0000 mg | ORAL_TABLET | Freq: Once | ORAL | Status: AC
  Administered 2016-12-28: 2000 mg via ORAL
  Filled 2016-12-28: qty 4

## 2016-12-28 MED ORDER — PRENATAL VITAMINS 0.8 MG PO TABS: 1.0000 | ORAL_TABLET | Freq: Every day | ORAL | 12 refills | Status: AC

## 2016-12-28 NOTE — MAU Note (Signed)
Has not had a period since July. Not sure if she s pregnant. Stated she feels something moving.

## 2016-12-28 NOTE — MAU Provider Note (Signed)
Chief Complaint:  Possible Pregnancy   First Provider Initiated Contact with Patient 12/28/16 1632     HPI: Chloe Saunders is a 26 y.o. G1P0 at 2w5dwho presents to maternity admissions reporting amenorrhea and feeling like something is moving in her abdomen.  "not sure if she is pregnant".. She reports good fetal movement, denies LOF, vaginal bleeding, vaginal itching/burning, urinary symptoms, h/a, dizziness, n/v, diarrhea, constipation or fever/chills.  She denies headache, visual changes or RUQ abdominal pain.  Upon seeing her, obviously pregnant, patient admits she "kind of knew" but just could not deal with it.  States family thinks she has been going to prenatal visits.    Possible Pregnancy  This is a new problem. The current episode started today. The problem occurs intermittently. The problem has been unchanged. Pertinent negatives include no abdominal pain, chills, congestion, fever, headaches, myalgias, nausea, urinary symptoms or vomiting. Nothing aggravates the symptoms. She has tried nothing for the symptoms.    RN Note: Has not had a period since July. Not sure if she s pregnant. Stated she feels something moving.   Past Medical History: No past medical history on file.  Past obstetric history: OB History  Gravida Para Term Preterm AB Living  1            SAB TAB Ectopic Multiple Live Births               # Outcome Date GA Lbr Len/2nd Weight Sex Delivery Anes PTL Lv  1 Current               Past Surgical History: No past surgical history on file.  Family History: No family history on file.  Social History: Social History  Substance Use Topics  . Smoking status: Current Every Day Smoker    Packs/day: 1.00    Years: 2.00    Types: Cigarettes  . Smokeless tobacco: Not on file  . Alcohol use Yes    Allergies: No Known Allergies  Meds:  No prescriptions prior to admission.    I have reviewed patient's Past Medical Hx, Surgical Hx, Family Hx, Social Hx,  medications and allergies.   ROS:  Review of Systems  Constitutional: Negative for chills and fever.  HENT: Negative for congestion.   Respiratory: Negative for shortness of breath.   Gastrointestinal: Negative for abdominal pain, nausea and vomiting.  Genitourinary: Positive for vaginal discharge. Negative for pelvic pain and vaginal bleeding.  Musculoskeletal: Negative for back pain and myalgias.  Neurological: Negative for headaches.   Other systems negative  Physical Exam  Patient Vitals for the past 24 hrs:  BP Pulse Resp  12/28/16 1643 132/83 82 -  12/28/16 1641 - - 16   Constitutional: Well-developed, well-nourished female in no acute distress.  Cardiovascular: normal rate and rhythm Respiratory: normal effort, clear to auscultation bilaterally GI: Abd soft, non-tender, gravid appropriate for gestational age.   No rebound or guarding.       Fundal height 31cm MS: Extremities nontender, no edema, normal ROM Neurologic: Alert and oriented x 4.  GU: Neg CVAT.  PELVIC EXAM: Cervix pink, visually closed, without lesion, scant white creamy discharge, vaginal walls and external genitalia normal Bimanual exam: Cervix firm, posterior, neg CMT, uterus nontender, Fundal Height consistent with dates, adnexa without tenderness, enlargement, or mass    FHT:  Baseline 140 , moderate variability, accelerations present, no decelerations Contractions: Rare   Labs: Results for orders placed or performed during the hospital encounter of 12/28/16 (from the past  24 hour(s))  Urinalysis, Routine w reflex microscopic     Status: Abnormal   Collection Time: 12/28/16  4:20 PM  Result Value Ref Range   Color, Urine YELLOW YELLOW   APPearance HAZY (A) CLEAR   Specific Gravity, Urine 1.013 1.005 - 1.030   pH 6.0 5.0 - 8.0   Glucose, UA >=500 (A) NEGATIVE mg/dL   Hgb urine dipstick NEGATIVE NEGATIVE   Bilirubin Urine NEGATIVE NEGATIVE   Ketones, ur NEGATIVE NEGATIVE mg/dL   Protein, ur  NEGATIVE NEGATIVE mg/dL   Nitrite NEGATIVE NEGATIVE   Leukocytes, UA LARGE (A) NEGATIVE   RBC / HPF 6-30 0 - 5 RBC/hpf   WBC, UA TOO NUMEROUS TO COUNT 0 - 5 WBC/hpf   Bacteria, UA FEW (A) NONE SEEN   Squamous Epithelial / LPF 0-5 (A) NONE SEEN   Mucous PRESENT   Pregnancy, urine POC     Status: Abnormal   Collection Time: 12/28/16  4:38 PM  Result Value Ref Range   Preg Test, Ur POSITIVE (A) NEGATIVE  Wet prep, genital     Status: Abnormal   Collection Time: 12/28/16  5:05 PM  Result Value Ref Range   Yeast Wet Prep HPF POC NONE SEEN NONE SEEN   Trich, Wet Prep PRESENT (A) NONE SEEN   Clue Cells Wet Prep HPF POC NONE SEEN NONE SEEN   WBC, Wet Prep HPF POC MANY (A) NONE SEEN   Sperm NONE SEEN       Imaging:  No results found.  MAU Course/MDM: I have ordered labs and reviewed results. Reviewed Trich infection and treatment. States does not want to get partner Rx as partner is in another state and will get his own tx NST reviewed Treatments in MAU included Flagyl 2gm.    Assessment: SIUP at 4756w0d Trichomoniasis - Plan: Discharge patient, US MFM OB COMP + 14 WK  Late prenatal care affecting pregnancy in third trimester - Plan: Discharge patient, US MFM OB COMP + 14 WK  Uterine irritability - Plan: Discharge patient, US MFM OB COMP + 14 WK   Plan: Discharge home Will schedule outpatient US for anatomy and dates Preterm Labor precautions and fetal kick counts Follow up in Office for prenatal visits and new OB  Encouraged to return here or to other Urgent Care/ED if she develops worsening of symptoms, increase in pain, fever, or other concerning symptoms.   Pt stable at time of discharge.  Wynelle BourgeoisMarie Mirjana Tarleton CNM, MSN Certified Nurse-Midwife 12/28/2016 5:34 PM

## 2016-12-28 NOTE — Discharge Instructions (Signed)
Prenatal Care WHAT IS PRENATAL CARE? Prenatal care is the process of caring for a pregnant woman before she gives birth. Prenatal care makes sure that she and her baby remain as healthy as possible throughout pregnancy. Prenatal care may be provided by a midwife, family practice health care provider, or a childbirth and pregnancy specialist (obstetrician). Prenatal care may include physical examinations, testing, treatments, and education on nutrition, lifestyle, and social support services. WHY IS PRENATAL CARE SO IMPORTANT? Early and consistent prenatal care increases the chance that you and your baby will remain healthy throughout your pregnancy. This type of care also decreases a babys risk of being born too early (prematurely), or being born smaller than expected (small for gestational age). Any underlying medical conditions you may have that could pose a risk during your pregnancy are discussed during prenatal care visits. You will also be monitored regularly for any new conditions that may arise during your pregnancy so they can be treated quickly and effectively. WHAT HAPPENS DURING PRENATAL CARE VISITS? Prenatal care visits may include the following: Discussion Tell your health care provider about any new signs or symptoms you have experienced since your last visit. These might include:  Nausea or vomiting.  Increased or decreased level of energy.  Difficulty sleeping.  Back or leg pain.  Weight changes.  Frequent urination.  Shortness of breath with physical activity.  Changes in your skin, such as the development of a rash or itchiness.  Vaginal discharge or bleeding.  Feelings of excitement or nervousness.  Changes in your babys movements. You may want to write down any questions or topics you want to discuss with your health care provider and bring them with you to your appointment. Examination During your first prenatal care visit, you will likely have a complete  physical exam. Your health care provider will often examine your vagina, cervix, and the position of your uterus, as well as check your heart, lungs, and other body systems. As your pregnancy progresses, your health care provider will measure the size of your uterus and your babys position inside your uterus. He or she may also examine you for early signs of labor. Your prenatal visits may also include checking your blood pressure and, after about 10-12 weeks of pregnancy, listening to your babys heartbeat. Testing Regular testing often includes:  Urinalysis. This checks your urine for glucose, protein, or signs of infection.  Blood count. This checks the levels of white and red blood cells in your body.  Tests for sexually transmitted infections (STIs). Testing for STIs at the beginning of pregnancy is routinely done and is required in many states.  Antibody testing. You will be checked to see if you are immune to certain illnesses, such as rubella, that can affect a developing fetus.  Glucose screen. Around 24-28 weeks of pregnancy, your blood glucose level will be checked for signs of gestational diabetes. Follow-up tests may be recommended.  Group B strep. This is a bacteria that is commonly found inside a womans vagina. This test will inform your health care provider if you need an antibiotic to reduce the amount of this bacteria in your body prior to labor and childbirth.  Ultrasound. Many pregnant women undergo an ultrasound screening around 18-20 weeks of pregnancy to evaluate the health of the fetus and check for any developmental abnormalities.  HIV (human immunodeficiency virus) testing. Early in your pregnancy, you will be screened for HIV. If you are at high risk for HIV, this test may be  repeated during your third trimester of pregnancy. You may be offered other testing based on your age, personal or family medical history, or other factors. HOW OFTEN SHOULD I PLAN TO SEE MY  HEALTH CARE PROVIDER FOR PRENATAL CARE? Your prenatal care check-up schedule depends on any medical conditions you have before, or develop during, your pregnancy. If you do not have any underlying medical conditions, you will likely be seen for checkups:  Monthly, during the first 6 months of pregnancy.  Twice a month during months 7 and 8 of pregnancy.  Weekly starting in the 9th month of pregnancy and until delivery. If you develop signs of early labor or other concerning signs or symptoms, you may need to see your health care provider more often. Ask your health care provider what prenatal care schedule is best for you. WHAT CAN I DO TO KEEP MYSELF AND MY BABY AS HEALTHY AS POSSIBLE DURING MY PREGNANCY?  Take a prenatal vitamin containing 400 micrograms (0.4 mg) of folic acid every day. Your health care provider may also ask you to take additional vitamins such as iodine, vitamin D, iron, copper, and zinc.  Take 1500-2000 mg of calcium daily starting at your 20th week of pregnancy until you deliver your baby.  Make sure you are up to date on your vaccinations. Unless directed otherwise by your health care provider:  You should receive a tetanus, diphtheria, and pertussis (Tdap) vaccination between the 27th and 36th week of your pregnancy, regardless of when your last Tdap immunization occurred. This helps protect your baby from whooping cough (pertussis) after he or she is born.  You should receive an annual inactivated influenza vaccine (IIV) to help protect you and your baby from influenza. This can be done at any point during your pregnancy.  Eat a well-rounded diet that includes:  Fresh fruits and vegetables.  Lean proteins.  Calcium-rich foods such as milk, yogurt, hard cheeses, and dark, leafy greens.  Whole grain breads.  Do noteat seafood high in mercury, including:  Swordfish.  Tilefish.  Shark.  King mackerel.  More than 6 oz tuna per week.  Do not eat:  Raw  or undercooked meats or eggs.  Unpasteurized foods, such as soft cheeses (brie, blue, or feta), juices, and milks.  Lunch meats.  Hot dogs that have not been heated until they are steaming.  Drink enough water to keep your urine clear or pale yellow. For many women, this may be 10 or more 8 oz glasses of water each day. Keeping yourself hydrated helps deliver nutrients to your baby and may prevent the start of pre-term uterine contractions.  Do not use any tobacco products including cigarettes, chewing tobacco, or electronic cigarettes. If you need help quitting, ask your health care provider.  Do not drink beverages containing alcohol. No safe level of alcohol consumption during pregnancy has been determined.  Do not use any illegal drugs. These can harm your developing baby or cause a miscarriage.  Ask your health care provider or pharmacist before taking any prescription or over-the-counter medicines, herbs, or supplements.  Limit your caffeine intake to no more than 200 mg per day.  Exercise. Unless told otherwise by your health care provider, try to get 30 minutes of moderate exercise most days of the week. Do not  do high-impact activities, contact sports, or activities with a high risk of falling, such as horseback riding or downhill skiing.  Get plenty of rest.  Avoid anything that raises your body temperature, such  as hot tubs and saunas.  If you own a cat, do not empty its litter box. Bacteria contained in cat feces can cause an infection called toxoplasmosis. This can result in serious harm to the fetus.  Stay away from chemicals such as insecticides, lead, mercury, and cleaning or paint products that contain solvents.  Do not have any X-rays taken unless medically necessary.  Take a childbirth and breastfeeding preparation class. Ask your health care provider if you need a referral or recommendation. This information is not intended to replace advice given to you by your  health care provider. Make sure you discuss any questions you have with your health care provider. Document Released: 11/02/2003 Document Revised: 04/03/2016 Document Reviewed: 01/14/2014 Elsevier Interactive Patient Education  2017 [employer]. Trichomoniasis Trichomoniasis is an infection caused by an organism called Trichomonas. The infection can affect both women and men. In women, the outer female genitalia and the vagina are affected. In men, the penis is mainly affected, but the prostate and other reproductive organs can also be involved. Trichomoniasis is a sexually transmitted infection (STI) and is most often passed to another person through sexual contact.  RISK FACTORS  Having unprotected sexual intercourse.  Having sexual intercourse with an infected partner. SIGNS AND SYMPTOMS  Symptoms of trichomoniasis in women include:  Abnormal gray-green frothy vaginal discharge.  Itching and irritation of the vagina.  Itching and irritation of the area outside the vagina. Symptoms of trichomoniasis in men include:   Penile discharge with or without pain.  Pain during urination. This results from inflammation of the urethra. DIAGNOSIS  Trichomoniasis may be found during a Pap test or physical exam. Your health care provider may use one of the following methods to help diagnose this infection:  Testing the pH of the vagina with a test tape.  Using a vaginal swab test that checks for the Trichomonas organism. A test is available that provides results within a few minutes.  Examining a urine sample.  Testing vaginal secretions. Your health care provider may test you for other STIs, including HIV. TREATMENT   You may be given medicine to fight the infection. Women should inform their health care provider if they could be or are pregnant. Some medicines used to treat the infection should not be taken during pregnancy.  Your health care provider may recommend over-the-counter  medicines or creams to decrease itching or irritation.  Your sexual partner will need to be treated if infected.  Your health care provider may test you for infection again 3 months after treatment. HOME CARE INSTRUCTIONS   Take medicines only as directed by your health care provider.  Take over-the-counter medicine for itching or irritation as directed by your health care provider.  Do not have sexual intercourse while you have the infection.  Women should not douche or wear tampons while they have the infection.  Discuss your infection with your partner. Your partner may have gotten the infection from you, or you may have gotten it from your partner.  Have your sex partner get examined and treated if necessary.  Practice safe, informed, and protected sex.  See your health care provider for other STI testing. SEEK MEDICAL CARE IF:   You still have symptoms after you finish your medicine.  You develop abdominal pain.  You have pain when you urinate.  You have bleeding after sexual intercourse.  You develop a rash.  Your medicine makes you sick or makes you throw up (vomit). MAKE SURE YOU:  Understand these instructions.  Will watch your condition.  Will get help right away if you are not doing well or get worse. This information is not intended to replace advice given to you by your health care provider. Make sure you discuss any questions you have with your health care provider. Document Released: 04/25/2001 Document Revised: 11/20/2014 Document Reviewed: 08/11/2013 Elsevier Interactive Patient Education  2017 [employer]. Third Trimester of Pregnancy The third trimester is from week 29 through week 40 (months 7 through 9). The third trimester is a time when the unborn baby (fetus) is growing rapidly. At the end of the ninth month, the fetus is about 20 inches in length and weighs 6-10 pounds. Body changes during your third trimester Your body goes through many  changes during pregnancy. The changes vary from woman to woman. During the third trimester:  Your weight will continue to increase. You can expect to gain 25-35 pounds (11-16 kg) by the end of the pregnancy.  You may begin to get stretch marks on your hips, abdomen, and breasts.  You may urinate more often because the fetus is moving lower into your pelvis and pressing on your bladder.  You may develop or continue to have heartburn. This is caused by increased hormones that slow down muscles in the digestive tract.  You may develop or continue to have constipation because increased hormones slow digestion and cause the muscles that push waste through your intestines to relax.  You may develop hemorrhoids. These are swollen veins (varicose veins) in the rectum that can itch or be painful.  You may develop swollen, bulging veins (varicose veins) in your legs.  You may have increased body aches in the pelvis, back, or thighs. This is due to weight gain and increased hormones that are relaxing your joints.  You may have changes in your hair. These can include thickening of your hair, rapid growth, and changes in texture. Some women also have hair loss during or after pregnancy, or hair that feels dry or thin. Your hair will most likely return to normal after your baby is born.  Your breasts will continue to grow and they will continue to become tender. A yellow fluid (colostrum) may leak from your breasts. This is the first milk you are producing for your baby.  Your belly button may stick out.  You may notice more swelling in your hands, face, or ankles.  You may have increased tingling or numbness in your hands, arms, and legs. The skin on your belly may also feel numb.  You may feel short of breath because of your expanding uterus.  You may have more problems sleeping. This can be caused by the size of your belly, increased need to urinate, and an increase in your body's  metabolism.  You may notice the fetus "dropping," or moving lower in your abdomen.  You may have increased vaginal discharge.  Your cervix becomes thin and soft (effaced) near your due date. What to expect at prenatal visits You will have prenatal exams every 2 weeks until week 36. Then you will have weekly prenatal exams. During a routine prenatal visit:  You will be weighed to make sure you and the fetus are growing normally.  Your blood pressure will be taken.  Your abdomen will be measured to track your baby's growth.  The fetal heartbeat will be listened to.  Any test results from the previous visit will be discussed.  You may have a cervical check near your  due date to see if you have effaced. At around 36 weeks, your health care provider will check your cervix. At the same time, your health care provider will also perform a test on the secretions of the vaginal tissue. This test is to determine if a type of bacteria, Group B streptococcus, is present. Your health care provider will explain this further. Your health care provider may ask you:  What your birth plan is.  How you are feeling.  If you are feeling the baby move.  If you have had any abnormal symptoms, such as leaking fluid, bleeding, severe headaches, or abdominal cramping.  If you are using any tobacco products, including cigarettes, chewing tobacco, and electronic cigarettes.  If you have any questions. Other tests or screenings that may be performed during your third trimester include:  Blood tests that check for low iron levels (anemia).  Fetal testing to check the health, activity level, and growth of the fetus. Testing is done if you have certain medical conditions or if there are problems during the pregnancy.  Nonstress test (NST). This test checks the health of your baby to make sure there are no signs of problems, such as the baby not getting enough oxygen. During this test, a belt is placed around  your belly. The baby is made to move, and its heart rate is monitored during movement. What is false labor? False labor is a condition in which you feel small, irregular tightenings of the muscles in the womb (contractions) that eventually go away. These are called Braxton Hicks contractions. Contractions may last for hours, days, or even weeks before true labor sets in. If contractions come at regular intervals, become more frequent, increase in intensity, or become painful, you should see your health care provider. What are the signs of labor?  Abdominal cramps.  Regular contractions that start at 10 minutes apart and become stronger and more frequent with time.  Contractions that start on the top of the uterus and spread down to the lower abdomen and back.  Increased pelvic pressure and dull back pain.  A watery or bloody mucus discharge that comes from the vagina.  Leaking of amniotic fluid. This is also known as your "water breaking." It could be a slow trickle or a gush. Let your doctor know if it has a color or strange odor. If you have any of these signs, call your health care provider right away, even if it is before your due date. Follow these instructions at home: Eating and drinking  Continue to eat regular, healthy meals.  Do not eat:  Raw meat or meat spreads.  Unpasteurized milk or cheese.  Unpasteurized juice.  Store-made salad.  Refrigerated smoked seafood.  Hot dogs or deli meat, unless they are piping hot.  More than 6 ounces of albacore tuna a week.  Shark, swordfish, king mackerel, or tile fish.  Store-made salads.  Raw sprouts, such as mung bean or alfalfa sprouts.  Take prenatal vitamins as told by your health care provider.  Take 1000 mg of calcium daily as told by your health care provider.  If you develop constipation:  Take over-the-counter or prescription medicines.  Drink enough fluid to keep your urine clear or pale yellow.  Eat  foods that are high in fiber, such as fresh fruits and vegetables, whole grains, and beans.  Limit foods that are high in fat and processed sugars, such as fried and sweet foods. Activity  Exercise only as directed by your  health care provider. Healthy pregnant women should aim for 2 hours and 30 minutes of moderate exercise per week. If you experience any pain or discomfort while exercising, stop.  Avoid heavy lifting.  Do not exercise in extreme heat or humidity, or at high altitudes.  Wear low-heel, comfortable shoes.  Practice good posture.  Do not travel far distances unless it is absolutely necessary and only with the approval of your health care provider.  Wear your seat belt at all times while in a car, on a bus, or on a plane.  Take frequent breaks and rest with your legs elevated if you have leg cramps or low back pain.  Do not use hot tubs, steam rooms, or saunas.  You may continue to have sex unless your health care provider tells you otherwise. Lifestyle  Do not use any products that contain nicotine or tobacco, such as cigarettes and e-cigarettes. If you need help quitting, ask your health care provider.  Do not drink alcohol.  Do not use any medicinal herbs or unprescribed drugs. These chemicals affect the formation and growth of the baby.  If you develop varicose veins:  Wear support pantyhose or compression stockings as told by your healthcare provider.  Elevate your feet for 15 minutes, 3-4 times a day.  Wear a supportive maternity bra to help with breast tenderness. General instructions  Take over-the-counter and prescription medicines only as told by your health care provider. There are medicines that are either safe or unsafe to take during pregnancy.  Take warm sitz baths to soothe any pain or discomfort caused by hemorrhoids. Use hemorrhoid cream or witch hazel if your health care provider approves.  Avoid cat litter boxes and soil used by cats.  These carry germs that can cause birth defects in the baby. If you have a cat, ask someone to clean the litter box for you.  To prepare for the arrival of your baby:  Take prenatal classes to understand, practice, and ask questions about the labor and delivery.  Make a trial run to the hospital.  Visit the hospital and tour the maternity area.  Arrange for maternity or paternity leave through employers.  Arrange for family and friends to take care of pets while you are in the hospital.  Purchase a rear-facing car seat and make sure you know how to install it in your car.  Pack your hospital bag.  Prepare the babys nursery. Make sure to remove all pillows and stuffed animals from the baby's crib to prevent suffocation.  Visit your dentist if you have not gone during your pregnancy. Use a soft toothbrush to brush your teeth and be gentle when you floss.  Keep all prenatal follow-up visits as told by your health care provider. This is important. Contact a health care provider if:  You are unsure if you are in labor or if your water has broken.  You become dizzy.  You have mild pelvic cramps, pelvic pressure, or nagging pain in your abdominal area.  You have lower back pain.  You have persistent nausea, vomiting, or diarrhea.  You have an unusual or bad smelling vaginal discharge.  You have pain when you urinate. Get help right away if:  You have a fever.  You are leaking fluid from your vagina.  You have spotting or bleeding from your vagina.  You have severe abdominal pain or cramping.  You have rapid weight loss or weight gain.  You have shortness of breath with chest pain.  You notice sudden or extreme swelling of your face, hands, ankles, feet, or legs.  Your baby makes fewer than 10 movements in 2 hours.  You have severe headaches that do not go away with medicine.  You have vision changes. Summary  The third trimester is from week 29 through week 40,  months 7 through 9. The third trimester is a time when the unborn baby (fetus) is growing rapidly.  During the third trimester, your discomfort may increase as you and your baby continue to gain weight. You may have abdominal, leg, and back pain, sleeping problems, and an increased need to urinate.  During the third trimester your breasts will keep growing and they will continue to become tender. A yellow fluid (colostrum) may leak from your breasts. This is the first milk you are producing for your baby.  False labor is a condition in which you feel small, irregular tightenings of the muscles in the womb (contractions) that eventually go away. These are called Braxton Hicks contractions. Contractions may last for hours, days, or even weeks before true labor sets in.  Signs of labor can include: abdominal cramps; regular contractions that start at 10 minutes apart and become stronger and more frequent with time; watery or bloody mucus discharge that comes from the vagina; increased pelvic pressure and dull back pain; and leaking of amniotic fluid. This information is not intended to replace advice given to you by your health care provider. Make sure you discuss any questions you have with your health care provider. Document Released: 10/24/2001 Document Revised: 04/06/2016 Document Reviewed: 12/31/2012 Elsevier Interactive Patient Education  2017 [employer]

## 2016-12-29 LAB — GC/CHLAMYDIA PROBE AMP (~~LOC~~) NOT AT ARMC
Chlamydia: NEGATIVE
Neisseria Gonorrhea: NEGATIVE

## 2017-01-01 ENCOUNTER — Encounter: Payer: Self-pay | Admitting: Obstetrics and Gynecology

## 2017-01-01 ENCOUNTER — Ambulatory Visit (INDEPENDENT_AMBULATORY_CARE_PROVIDER_SITE_OTHER): Payer: Self-pay | Admitting: Obstetrics and Gynecology

## 2017-01-01 ENCOUNTER — Other Ambulatory Visit: Payer: Self-pay | Admitting: Obstetrics and Gynecology

## 2017-01-01 ENCOUNTER — Ambulatory Visit (INDEPENDENT_AMBULATORY_CARE_PROVIDER_SITE_OTHER): Payer: Medicaid Other | Admitting: Clinical

## 2017-01-01 VITALS — BP 114/75 | HR 75 | Wt 134.3 lb

## 2017-01-01 DIAGNOSIS — A599 Trichomoniasis, unspecified: Secondary | ICD-10-CM | POA: Insufficient documentation

## 2017-01-01 DIAGNOSIS — O0990 Supervision of high risk pregnancy, unspecified, unspecified trimester: Secondary | ICD-10-CM

## 2017-01-01 DIAGNOSIS — F4323 Adjustment disorder with mixed anxiety and depressed mood: Secondary | ICD-10-CM

## 2017-01-01 DIAGNOSIS — O99323 Drug use complicating pregnancy, third trimester: Secondary | ICD-10-CM

## 2017-01-01 DIAGNOSIS — K219 Gastro-esophageal reflux disease without esophagitis: Secondary | ICD-10-CM | POA: Insufficient documentation

## 2017-01-01 DIAGNOSIS — F1994 Other psychoactive substance use, unspecified with psychoactive substance-induced mood disorder: Secondary | ICD-10-CM

## 2017-01-01 DIAGNOSIS — F1414 Cocaine abuse with cocaine-induced mood disorder: Secondary | ICD-10-CM

## 2017-01-01 DIAGNOSIS — O0992 Supervision of high risk pregnancy, unspecified, second trimester: Secondary | ICD-10-CM

## 2017-01-01 DIAGNOSIS — F10220 Alcohol dependence with intoxication, uncomplicated: Secondary | ICD-10-CM

## 2017-01-01 DIAGNOSIS — O99320 Drug use complicating pregnancy, unspecified trimester: Secondary | ICD-10-CM | POA: Insufficient documentation

## 2017-01-01 MED ORDER — TUBERCULIN PPD 5 UNIT/0.1ML ID SOLN
5.0000 [IU] | Freq: Once | INTRADERMAL | Status: AC
Start: 2017-01-01 — End: 2017-01-03
  Administered 2017-01-01: 5 [IU] via INTRADERMAL

## 2017-01-01 MED ORDER — RANITIDINE HCL 150 MG PO TABS: 150.0000 mg | ORAL_TABLET | Freq: Two times a day (BID) | ORAL | 1 refills | Status: AC

## 2017-01-01 NOTE — BH Specialist Note (Addendum)
Session Start time: 9:50  End Time: 10:20 Total Time:  30 minutes Type of Service: Behavioral Health - Individual/Family Interpreter: No.   Interpreter Name & Language: n/a # [facility] July [facility]-June 2018: 1st  SUBJECTIVE: Chloe Saunders is a 26 y.o. female  Pt. was referred by Venia CarbonJennifer Rasch, NP for:  anxiety and psychosocial stressors. Pt. reports the following symptoms/concerns: Pt says her primary concern today is feeling anxious over unresolved issues in childhood, and wanting to have an improved relationship with her mother, as she gets closer to giving birth to her first child. Pt says it helps her cope to talk about her feelings. Duration of problem:  Current pregnancy Severity: mild Previous treatment: One year ago, [facility] for SI   OBJECTIVE: Mood: Anxious and Dysphoric & Affect: Tearful Risk of harm to self or others: Low risk of harm to self, states no SI since one year prior; no known risk of harm to others Assessments administered: PHQ9: 9/ GAD7: 7  LIFE CONTEXT:  Family & Social: Lives with a female friend(not FOB), some family supportive(favorite aunt); strained relationship with mother. One aunt diagnosed with schizophrenia, one sister diagnosed with bipolar affective disorder School/ Work: Undetermined  Self-Care: Positive outlook on life's changes, walks daily  Life changes: Current pregnancy What is important to pt/family (values): Healthy baby, Learning how to be a mother  GOALS ADDRESSED:  -Reduce symptoms of depression and anxiety related to life stressors  INTERVENTIONS: Motivational Interviewing   ASSESSMENT:  Pt currently experiencing Adjustment disorder with mixed anxious and depressed mood.  Pt may benefit from psychoeducation and brief therapeutic intervention regarding coping with symptoms of anxiety and depression, along with community resources.   PLAN: 1. F/U with behavioral health clinician: Two weeks 2. Behavioral Health meds: none 3. Behavioral  recommendations:  -Continue to take daily walks, as needed, to cope with anxious feelings -Read educational material regarding coping with symptoms of anxiety and depression -Consider sleep app for improved sleep/self-coping strategy -Consider [employer] for housing application/housing hotline for housing questions -Consider discussion with mother about feelings 4. Referral: Brief Counseling/Psychotherapy, Publishing rights managerCommunity Resource and Psychoeducation 5. From scale of 1-10, how likely are you to follow plan: 8  Rae LipsJamie C Mcmannes    Depression screen [facility] 2/9 01/01/2017  Decreased Interest 3  Down, Depressed, Hopeless 1  PHQ - 2 Score 4  Altered sleeping 1  Tired, decreased energy 1  Change in appetite 2  Feeling bad or failure about yourself  1  Trouble concentrating 0  Suicidal thoughts 0  PHQ-9 Score 9   GAD 7 : Generalized Anxiety Score 01/01/2017  Nervous, Anxious, on Edge 1  Control/stop worrying 1  Worry too much - different things 1  Trouble relaxing 1  Restless 1  Easily annoyed or irritable 1  Afraid - awful might happen 1  Total GAD 7 Score 7

## 2017-01-01 NOTE — Progress Notes (Signed)
PRENATAL VISIT NOTE  Subjective:  Chloe Saunders is a 26 y.o. G1P0 at [redacted]w[redacted]d being seen today for her first OB appointment.  She is currently monitored for the following issues for this high-risk pregnancy and has Substance induced mood disorder (HCC); Alcohol dependence with acute alcoholic intoxication without complication (HCC); Cocaine abuse with cocaine-induced mood disorder (HCC); Supervision of high risk pregnancy, antepartum; GERD (gastroesophageal reflux disease); and Trichomonas infection on her problem list.  Patient reports no complaints.    Movement: Present. Denies leaking of fluid.   The following portions of the patient's history were reviewed and updated as appropriate: allergies, current medications, past family history, past medical history, past social history, past surgical history and problem list. Problem list updated.  Objective:   Vitals:   01/01/17 0808  BP: 114/75  Pulse: 75  Weight: 134 lb 4.8 oz (60.9 kg)    Fetal Status: Fetal Heart Rate (bpm): 142 Fundal Height: 32 cm Movement: Present    BP 114/75   Pulse 75   Wt 134 lb 4.8 oz (60.9 kg)   LMP 05/27/2016 (Approximate)   BMI 25.38 kg/m  CONSTITUTIONAL: Well-developed, well-nourished female in no acute distress.  HENT:  Normocephalic, atraumatic, External right and left ear normal. Oropharynx is clear and moist EYES: Conjunctivae and EOM are normal. Pupils are equal, round, and reactive to light. No scleral icterus.  NECK: Normal range of motion, supple, no masses.  Normal thyroid.  SKIN: Skin is warm and dry. No rash noted. Not diaphoretic. No erythema. No pallor. NEUROLOGIC: Alert and oriented to person, place, and time. Normal reflexes, muscle tone coordination. No cranial nerve deficit noted. PSYCHIATRIC: Normal mood and affect. Normal behavior. Normal judgment and thought content. CARDIOVASCULAR: Normal heart rate noted, regular rhythm RESPIRATORY: Clear to auscultation bilaterally. Effort and  breath sounds normal, no problems with respiration noted. BREASTS: Symmetric in size. No masses, skin changes, nipple drainage, or lymphadenopathy. ABDOMEN: Soft, normal bowel sounds, no distention noted.  No tenderness, rebound or guarding.  PELVIC: Difficult exam due to patient irritability, unable to tolerate speculum.  Normal appearing external genitalia; normal appearing vaginal mucosa. Normal appearing discharge.  Pap smear obtained.  Normal uterine size, no other palpable masses, no uterine or adnexal tenderness. MUSCULOSKELETAL: Normal range of motion. No tenderness.  No cyanosis, clubbing, or edema.  2+ distal pulses.   Assessment and Plan:  Pregnancy: G1P0 at [redacted]w[redacted]d   1. High-risk pregnancy in second trimester  - Glucose Tolerance, 2 Hours w/1 Hour - Cytology - PAP - Pain Mgmt, Profile 6 Conf w/o mM, U - Tdap vaccine greater than or equal to 7yo IM - TB testing   2. Cocaine abuse with cocaine-induced mood disorder (HCC) - patient declines use at this time.   3. Substance induced mood disorder (HCC) - Will see Asher Muir to today with behavioral health.   4. Alcohol dependence with acute alcoholic intoxication without complication (HCC) - Patient declines alcohol use during pregnancy.   5. Supervision of high risk pregnancy, antepartum - Glucose Tolerance, 2 Hours w/1 Hour - Cytology - PAP - Pain Mgmt, Profile 6 Conf w/o mM, U - Tdap vaccine greater than or equal to 7yo IM - TB testing    6. Gastroesophageal reflux disease, esophagitis presence not specified - RX: Zantac   7. Trichomonas infection - Patient treated in MAU on 2/15  Preterm labor symptoms and general obstetric precautions including but not limited to vaginal bleeding, contractions, leaking of fluid and fetal movement were reviewed in  detail with the patient. Please refer to After Visit Summary for other counseling recommendations.  Return in about 2 weeks (around 01/15/2017).   Duane LopeJennifer I Rasch, NP

## 2017-01-02 LAB — MONITOR DRUG PROFILE 10(MW)
AMPHETAMINE SCREEN URINE: NEGATIVE ng/mL
BARBITURATE SCREEN URINE: NEGATIVE ng/mL
BENZODIAZEPINE SCREEN, URINE: NEGATIVE ng/mL
CANNABINOIDS UR QL SCN: NEGATIVE ng/mL
Cocaine (Metab) Scrn, Ur: NEGATIVE ng/mL
Creatinine(Crt), U: 69.9 mg/dL (ref 20.0–300.0)
METHADONE SCREEN, URINE: NEGATIVE ng/mL
OXYCODONE+OXYMORPHONE UR QL SCN: NEGATIVE ng/mL
Opiate Scrn, Ur: NEGATIVE ng/mL
PH UR, DRUG SCRN: 5.7 (ref 4.5–8.9)
Phencyclidine Qn, Ur: NEGATIVE ng/mL
Propoxyphene Scrn, Ur: NEGATIVE ng/mL

## 2017-01-02 LAB — CYTOLOGY - PAP
ADEQUACY: ABSENT
Diagnosis: NEGATIVE

## 2017-01-02 LAB — GLUCOSE TOLERANCE, 2 HOURS W/ 1HR
GLUCOSE, 1 HOUR: 101 mg/dL (ref 65–179)
GLUCOSE, 2 HOUR: 70 mg/dL (ref 65–152)
Glucose, Fasting: 75 mg/dL (ref 65–91)

## 2017-01-03 ENCOUNTER — Other Ambulatory Visit: Payer: Self-pay

## 2017-01-03 ENCOUNTER — Ambulatory Visit: Payer: Self-pay | Admitting: *Deleted

## 2017-01-03 DIAGNOSIS — O0990 Supervision of high risk pregnancy, unspecified, unspecified trimester: Secondary | ICD-10-CM

## 2017-01-03 NOTE — Progress Notes (Unsigned)
Pt here today for PPD read.  PPD resulted 0 mm duration from L forearm.  Letter given to patient in regards to negative PPD results.   Obtained prenatal profile today as requested.

## 2017-01-05 ENCOUNTER — Ambulatory Visit (HOSPITAL_COMMUNITY): Admission: RE | Admit: 2017-01-05 | Payer: Self-pay | Source: Ambulatory Visit

## 2017-01-08 LAB — HEMOGLOBINOPATHY EVALUATION
HEMOGLOBIN F QUANTITATION: 0 % (ref 0.0–2.0)
HGB A: 98 % (ref 96.4–98.8)
HGB C: 0 %
HGB S: 0 %
HGB VARIANT: 0 %
Hemoglobin A2 Quantitation: 2 % (ref 1.8–3.2)

## 2017-01-08 LAB — PRENATAL PROFILE I(LABCORP)
ANTIBODY SCREEN: NEGATIVE
BASOS ABS: 0 10*3/uL (ref 0.0–0.2)
BASOS: 0 %
EOS (ABSOLUTE): 0.1 10*3/uL (ref 0.0–0.4)
Eos: 1 %
HEMATOCRIT: 31.7 % — AB (ref 34.0–46.6)
Hemoglobin: 10.8 g/dL — ABNORMAL LOW (ref 11.1–15.9)
Hepatitis B Surface Ag: NEGATIVE
Immature Grans (Abs): 0 10*3/uL (ref 0.0–0.1)
Immature Granulocytes: 1 %
LYMPHS ABS: 1.9 10*3/uL (ref 0.7–3.1)
Lymphs: 25 %
MCH: 27.8 pg (ref 26.6–33.0)
MCHC: 34.1 g/dL (ref 31.5–35.7)
MCV: 82 fL (ref 79–97)
MONOCYTES: 8 %
MONOS ABS: 0.6 10*3/uL (ref 0.1–0.9)
NEUTROS ABS: 4.9 10*3/uL (ref 1.4–7.0)
Neutrophils: 65 %
PLATELETS: 177 10*3/uL (ref 150–379)
RBC: 3.89 x10E6/uL (ref 3.77–5.28)
RDW: 14.8 % (ref 12.3–15.4)
RPR Ser Ql: NONREACTIVE
RUBELLA: 1.53 {index} (ref >0.99)
Rh Factor: NEGATIVE
WBC: 7.5 10*3/uL (ref 3.4–10.8)

## 2017-01-10 ENCOUNTER — Other Ambulatory Visit (HOSPITAL_COMMUNITY): Payer: Self-pay | Admitting: Advanced Practice Midwife

## 2017-01-10 ENCOUNTER — Ambulatory Visit (HOSPITAL_COMMUNITY)
Admission: RE | Admit: 2017-01-10 | Discharge: 2017-01-10 | Disposition: A | Discharge status: Home / Self Care | Payer: Medicaid Other | Source: Ambulatory Visit | Attending: Advanced Practice Midwife | Admitting: Advanced Practice Midwife

## 2017-01-10 ENCOUNTER — Encounter (HOSPITAL_COMMUNITY): Payer: Self-pay

## 2017-01-10 DIAGNOSIS — F1099 Alcohol use, unspecified with unspecified alcohol-induced disorder: Secondary | ICD-10-CM | POA: Diagnosis not present

## 2017-01-10 DIAGNOSIS — Z363 Encounter for antenatal screening for malformations: Secondary | ICD-10-CM

## 2017-01-10 DIAGNOSIS — N858 Other specified noninflammatory disorders of uterus: Secondary | ICD-10-CM

## 2017-01-10 DIAGNOSIS — Z3A32 32 weeks gestation of pregnancy: Secondary | ICD-10-CM

## 2017-01-10 DIAGNOSIS — O0933 Supervision of pregnancy with insufficient antenatal care, third trimester: Secondary | ICD-10-CM

## 2017-01-10 DIAGNOSIS — A599 Trichomoniasis, unspecified: Secondary | ICD-10-CM

## 2017-01-10 DIAGNOSIS — F1910 Other psychoactive substance abuse, uncomplicated: Secondary | ICD-10-CM

## 2017-01-10 DIAGNOSIS — O99310 Alcohol use complicating pregnancy, unspecified trimester: Secondary | ICD-10-CM | POA: Diagnosis not present

## 2017-01-10 DIAGNOSIS — O99320 Drug use complicating pregnancy, unspecified trimester: Secondary | ICD-10-CM

## 2017-01-10 DIAGNOSIS — Z3A33 33 weeks gestation of pregnancy: Secondary | ICD-10-CM | POA: Insufficient documentation

## 2017-01-10 DIAGNOSIS — O99333 Smoking (tobacco) complicating pregnancy, third trimester: Secondary | ICD-10-CM

## 2017-01-10 DIAGNOSIS — N859 Noninflammatory disorder of uterus, unspecified: Secondary | ICD-10-CM

## 2017-01-10 DIAGNOSIS — O99323 Drug use complicating pregnancy, third trimester: Secondary | ICD-10-CM | POA: Insufficient documentation

## 2017-01-10 DIAGNOSIS — O99343 Other mental disorders complicating pregnancy, third trimester: Secondary | ICD-10-CM | POA: Diagnosis not present

## 2017-01-12 ENCOUNTER — Encounter: Payer: Self-pay | Admitting: Obstetrics and Gynecology

## 2017-01-12 DIAGNOSIS — Z6791 Unspecified blood type, Rh negative: Secondary | ICD-10-CM | POA: Insufficient documentation

## 2017-01-12 DIAGNOSIS — O0930 Supervision of pregnancy with insufficient antenatal care, unspecified trimester: Secondary | ICD-10-CM | POA: Insufficient documentation

## 2017-01-12 DIAGNOSIS — O26893 Other specified pregnancy related conditions, third trimester: Secondary | ICD-10-CM

## 2017-01-15 ENCOUNTER — Ambulatory Visit: Payer: Self-pay

## 2017-01-15 ENCOUNTER — Encounter: Payer: Self-pay | Admitting: Family Medicine

## 2017-01-16 ENCOUNTER — Encounter (HOSPITAL_COMMUNITY): Payer: Self-pay | Admitting: *Deleted

## 2017-01-16 ENCOUNTER — Inpatient Hospital Stay (HOSPITAL_COMMUNITY)
Admission: AD | Admit: 2017-01-16 | Discharge: 2017-01-16 | Disposition: A | Discharge status: Home / Self Care | Payer: Medicaid Other | Source: Ambulatory Visit | Attending: Obstetrics & Gynecology | Admitting: Obstetrics & Gynecology

## 2017-01-16 DIAGNOSIS — N859 Noninflammatory disorder of uterus, unspecified: Secondary | ICD-10-CM

## 2017-01-16 DIAGNOSIS — O09893 Supervision of other high risk pregnancies, third trimester: Secondary | ICD-10-CM

## 2017-01-16 DIAGNOSIS — N858 Other specified noninflammatory disorders of uterus: Secondary | ICD-10-CM

## 2017-01-16 DIAGNOSIS — Z3A34 34 weeks gestation of pregnancy: Secondary | ICD-10-CM | POA: Diagnosis not present

## 2017-01-16 DIAGNOSIS — O99320 Drug use complicating pregnancy, unspecified trimester: Secondary | ICD-10-CM

## 2017-01-16 DIAGNOSIS — O0932 Supervision of pregnancy with insufficient antenatal care, second trimester: Secondary | ICD-10-CM

## 2017-01-16 DIAGNOSIS — R109 Unspecified abdominal pain: Secondary | ICD-10-CM | POA: Diagnosis not present

## 2017-01-16 DIAGNOSIS — Z87891 Personal history of nicotine dependence: Secondary | ICD-10-CM | POA: Insufficient documentation

## 2017-01-16 DIAGNOSIS — O0990 Supervision of high risk pregnancy, unspecified, unspecified trimester: Secondary | ICD-10-CM

## 2017-01-16 DIAGNOSIS — A599 Trichomoniasis, unspecified: Secondary | ICD-10-CM

## 2017-01-16 DIAGNOSIS — Z6791 Unspecified blood type, Rh negative: Secondary | ICD-10-CM

## 2017-01-16 DIAGNOSIS — O26893 Other specified pregnancy related conditions, third trimester: Secondary | ICD-10-CM | POA: Diagnosis not present

## 2017-01-16 HISTORY — DX: Other specified health status: Z78.9

## 2017-01-16 LAB — URINALYSIS, ROUTINE W REFLEX MICROSCOPIC
Bilirubin Urine: NEGATIVE
GLUCOSE, UA: 50 mg/dL — AB
Hgb urine dipstick: NEGATIVE
KETONES UR: NEGATIVE mg/dL
NITRITE: NEGATIVE
PH: 6 (ref 5.0–8.0)
Protein, ur: NEGATIVE mg/dL
Specific Gravity, Urine: 1.012 (ref 1.005–1.030)

## 2017-01-16 LAB — RAPID URINE DRUG SCREEN, HOSP PERFORMED
AMPHETAMINES: NOT DETECTED
BENZODIAZEPINES: NOT DETECTED
Barbiturates: NOT DETECTED
Cocaine: NOT DETECTED
OPIATES: NOT DETECTED
Tetrahydrocannabinol: NOT DETECTED

## 2017-01-16 LAB — WET PREP, GENITAL
CLUE CELLS WET PREP: NONE SEEN
Sperm: NONE SEEN
Trich, Wet Prep: NONE SEEN
Yeast Wet Prep HPF POC: NONE SEEN

## 2017-01-16 LAB — ABO/RH: ABO/RH(D): O NEG

## 2017-01-16 MED ORDER — RHO D IMMUNE GLOBULIN 1500 UNIT/2ML IJ SOSY
300.0000 ug | PREFILLED_SYRINGE | Freq: Once | INTRAMUSCULAR | Status: AC
  Administered 2017-01-16: 300 ug via INTRAMUSCULAR
  Filled 2017-01-16: qty 2

## 2017-01-16 NOTE — MAU Provider Note (Signed)
History     CSN: [id number]  Arrival date and time: 01/16/17 [id number]   First Provider Initiated Contact with Patient 01/16/17 1037     HPI  26 yo G1P0 at 5630w5d by 3rd trimester ultrasound who presents today with abdominal cramping that comes and goes.  Started yesterday evening, getting more frequent, but not more intense.  Notes small amount of vaginal discharge, no vaginal bleeding.  She is feeling baby move. Trich diagnosed and treated last month but has not had TOC. She denies new sexual partners or drug use.  She has not had much to eat or drink today and has not been sleeping well.  OB History    Gravida Para Term Preterm AB Living   1             SAB TAB Ectopic Multiple Live Births                  Past Medical History:  Diagnosis Date  . Medical history non-contributory     Past Surgical History:  Procedure Laterality Date  . NO PAST SURGERIES      No family history on file.  Social History  Substance Use Topics  . Smoking status: Former Smoker    Packs/day: 1.00    Years: 2.00    Types: Cigarettes  . Smokeless tobacco: Former NeurosurgeonUser  . Alcohol use Yes    Allergies: No Known Allergies  Prescriptions Prior to Admission  Medication Sig Dispense Refill Last Dose  . Prenatal Multivit-Min-Fe-FA (PRENATAL VITAMINS) 0.8 MG tablet Take 1 tablet by mouth daily. 30 tablet 12 01/16/2017 at Unknown time  . ranitidine (ZANTAC) 150 MG tablet Take 1 tablet (150 mg total) by mouth 2 (two) times daily. 60 tablet 1 Past Week at Unknown time    Review of Systems  Constitutional: Negative for fever.  Respiratory: Negative for shortness of breath.   Cardiovascular: Negative for chest pain and leg swelling.  Gastrointestinal: Positive for constipation. Negative for abdominal pain, nausea and vomiting.  Genitourinary: Negative for dysuria and vaginal bleeding.  Musculoskeletal: Positive for back pain.  Skin: Negative for rash.  Neurological: Negative for dizziness and headaches.    Physical Exam   Blood pressure 126/80, pulse 88, temperature 98.2 F (36.8 C), resp. rate 18, height 5\' 1"  (1.549 m), weight 140 lb (63.5 kg), last menstrual period 05/27/2016.  Physical Exam  Constitutional: She is oriented to person, place, and time. She appears well-developed and well-nourished.  HENT:  Head: Normocephalic.  Right Ear: External ear normal.  Left Ear: External ear normal.  Mouth/Throat: Oropharynx is clear and moist.  Eyes: Conjunctivae and EOM are normal.  Neck: Normal range of motion. Neck supple.  Cardiovascular: Normal rate and regular rhythm.   No murmur heard. Respiratory: Effort normal and breath sounds normal. She has no wheezes. She has no rales.  GI: Soft. There is no tenderness.  gravid  Musculoskeletal: She exhibits no edema.  Neurological: She is alert and oriented to person, place, and time.  Skin: Skin is warm and dry.  Psychiatric: She has a normal mood and affect.    MAU Course  Procedures  MDM Reviewed toco - initially with uterine irritability but also had a few irregular contractions.  None since a little before 12p.    Obtained labs for preterm contractions:   -repeat wet prep negative -UDS negative -UA not consistent with UTI  Re-eval at 1300 - not feeling the cramping sensations any more and SVE at 1220  closed/thick/high  Assessment and Plan  PTIUP at [redacted]w[redacted]d  Uterine irrritability:  Continue routine care; missed prenatal visit yesterday, instructed patient to call and reschedule  Return precautions given:  LOF, VB, contractions regular every 5 minutes or closer.  Patient stable at time of discharge.    Charlsie Merles 01/16/2017, 11:27 AM

## 2017-01-16 NOTE — Discharge Instructions (Signed)

## 2017-01-16 NOTE — MAU Note (Signed)
Pt presents to MAU with complaints of lower abdominal cramping since last night.Denies any vaginal bleeding or abnormal discharge. Pt had an appointment yesterday but was unable to come.

## 2017-01-17 LAB — RH IG WORKUP (INCLUDES ABO/RH)
ABO/RH(D): O NEG
Antibody Screen: NEGATIVE
Fetal Screen: NEGATIVE
GESTATIONAL AGE(WKS): 35
Unit division: 0

## 2017-09-14 ENCOUNTER — Encounter (HOSPITAL_COMMUNITY): Payer: Self-pay | Admitting: Emergency Medicine

## 2017-09-14 ENCOUNTER — Emergency Department (HOSPITAL_COMMUNITY)
Admission: EM | Admit: 2017-09-14 | Discharge: 2017-09-14 | Disposition: A | Discharge status: Home / Self Care | Payer: Medicaid Other | Attending: Emergency Medicine | Admitting: Emergency Medicine

## 2017-09-14 DIAGNOSIS — Z79899 Other long term (current) drug therapy: Secondary | ICD-10-CM | POA: Insufficient documentation

## 2017-09-14 DIAGNOSIS — Z87891 Personal history of nicotine dependence: Secondary | ICD-10-CM | POA: Insufficient documentation

## 2017-09-14 DIAGNOSIS — F1010 Alcohol abuse, uncomplicated: Secondary | ICD-10-CM

## 2017-09-14 DIAGNOSIS — F10920 Alcohol use, unspecified with intoxication, uncomplicated: Secondary | ICD-10-CM

## 2017-09-14 LAB — COMPREHENSIVE METABOLIC PANEL
ALT: 45 U/L (ref 14–54)
AST: 43 U/L — ABNORMAL HIGH (ref 15–41)
Albumin: 3.9 g/dL (ref 3.5–5.0)
Alkaline Phosphatase: 46 U/L (ref 38–126)
Anion gap: 8 (ref 5–15)
BILIRUBIN TOTAL: 0.5 mg/dL (ref 0.3–1.2)
BUN: 12 mg/dL (ref 6–20)
CHLORIDE: 111 mmol/L (ref 101–111)
CO2: 21 mmol/L — ABNORMAL LOW (ref 22–32)
CREATININE: 0.66 mg/dL (ref 0.44–1.00)
Calcium: 8.6 mg/dL — ABNORMAL LOW (ref 8.9–10.3)
Glucose, Bld: 89 mg/dL (ref 65–99)
Potassium: 3.7 mmol/L (ref 3.5–5.1)
Sodium: 140 mmol/L (ref 135–145)
TOTAL PROTEIN: 6.6 g/dL (ref 6.5–8.1)

## 2017-09-14 LAB — RAPID URINE DRUG SCREEN, HOSP PERFORMED
Amphetamines: NOT DETECTED
BARBITURATES: NOT DETECTED
BENZODIAZEPINES: NOT DETECTED
COCAINE: NOT DETECTED
OPIATES: NOT DETECTED
Tetrahydrocannabinol: NOT DETECTED

## 2017-09-14 LAB — URINALYSIS, ROUTINE W REFLEX MICROSCOPIC
Bilirubin Urine: NEGATIVE
Glucose, UA: NEGATIVE mg/dL
Hgb urine dipstick: NEGATIVE
Ketones, ur: NEGATIVE mg/dL
LEUKOCYTES UA: NEGATIVE
NITRITE: NEGATIVE
PROTEIN: NEGATIVE mg/dL
SPECIFIC GRAVITY, URINE: 1.011 (ref 1.005–1.030)
pH: 6 (ref 5.0–8.0)

## 2017-09-14 LAB — ETHANOL: ALCOHOL ETHYL (B): 449 mg/dL — AB (ref <10)

## 2017-09-14 LAB — CBC
HEMATOCRIT: 38.5 % (ref 36.0–46.0)
Hemoglobin: 13 g/dL (ref 12.0–15.0)
MCH: 31.9 pg (ref 26.0–34.0)
MCHC: 33.8 g/dL (ref 30.0–36.0)
MCV: 94.4 fL (ref 78.0–100.0)
PLATELETS: 241 10*3/uL (ref 150–400)
RBC: 4.08 MIL/uL (ref 3.87–5.11)
RDW: 13.6 % (ref 11.5–15.5)
WBC: 6.4 10*3/uL (ref 4.0–10.5)

## 2017-09-14 LAB — SALICYLATE LEVEL

## 2017-09-14 LAB — ACETAMINOPHEN LEVEL: Acetaminophen (Tylenol), Serum: <10 ug/mL — ABNORMAL LOW (ref 10–30)

## 2017-09-14 LAB — PREGNANCY, URINE: Preg Test, Ur: NEGATIVE

## 2017-09-14 MED ORDER — SODIUM CHLORIDE 0.9 % IV BOLUS (SEPSIS)
1000.0000 mL | Freq: Once | INTRAVENOUS | Status: AC
  Administered 2017-09-14: 1000 mL via INTRAVENOUS

## 2017-09-14 NOTE — ED Notes (Signed)
Pt friend walked back to the pod to pick up pt for discharge, this RN wheeled pt to the lobby with friend. Pt a.o, ambulatory upon discharge. NAD

## 2017-09-14 NOTE — ED Triage Notes (Signed)
Pt intoxicated. Drank 1 1/2 gallon of gin today. States she wants detox and to be placed in rehab. Denies SI/HI.

## 2017-09-14 NOTE — ED Notes (Signed)
Lab to add on acetaminophen and salicylate levels.

## 2017-09-14 NOTE — ED Notes (Signed)
ED Provider at bedside. 

## 2017-09-14 NOTE — ED Notes (Signed)
Meal tray delivered to pt. Pt agreed to provide urine sample after she eats

## 2017-09-14 NOTE — ED Notes (Signed)
Pt escorted to treatment room by security and [employer].

## 2017-09-14 NOTE — ED Provider Notes (Signed)
[facility] EMERGENCY DEPARTMENT Provider Note   CSN: [id number] Arrival date & time: 09/14/17  1348     History   Chief Complaint Chief Complaint  Patient presents with  . detox  . Alcohol Intoxication    HPI Chloe Saunders is a 26 y.o. female.  Pt presents to the ED today for alcohol abuse.  She told triage that she wanted detox.  She denies si/hi.  She said she drank 1 1/2 gallon of gin today.  Pt said she took a lot of pills for her nerves.  She was agitated in triage and the waiting room, so she was escorted back to her room with security and with police.  The pt is a poor historian.  She did not present with any family to give additional history.      Past Medical History:  Diagnosis Date  . Medical history non-contributory     Patient Active Problem List   Diagnosis Date Noted  . Rh negative status during pregnancy in third trimester 01/12/2017  . Insufficient prenatal care 01/12/2017  . Supervision of high risk pregnancy, antepartum 01/01/2017  . GERD (gastroesophageal reflux disease) 01/01/2017  . Trichomonas infection 01/01/2017  . Substance abuse affecting pregnancy, antepartum 01/01/2017  . Substance induced mood disorder (HCC) 10/26/2015  . Alcohol dependence with acute alcoholic intoxication without complication (HCC) 10/26/2015  . Cocaine abuse with cocaine-induced mood disorder (HCC) 10/26/2015    Past Surgical History:  Procedure Laterality Date  . NO PAST SURGERIES      OB History    Gravida Para Term Preterm AB Living   1             SAB TAB Ectopic Multiple Live Births                   Home Medications    Prior to Admission medications   Medication Sig Start Date End Date Taking? Authorizing Provider  Prenatal Multivit-Min-Fe-FA (PRENATAL VITAMINS) 0.8 MG tablet Take 1 tablet by mouth daily. 12/28/16   Aviva Signs, CNM  ranitidine (ZANTAC) 150 MG tablet Take 1 tablet (150 mg total) by mouth 2 (two) times daily.  01/01/17   Rasch, Harolyn Rutherford, NP    Family History No family history on file.  Social History Social History  Substance Use Topics  . Smoking status: Former Smoker    Packs/day: 1.00    Years: 2.00    Types: Cigarettes  . Smokeless tobacco: Former Neurosurgeon  . Alcohol use Yes     Allergies   Patient has no known allergies.   Review of Systems Review of Systems  All other systems reviewed and are negative.    Physical Exam Updated Vital Signs BP 116/70   Pulse 82   Temp 98.1 F (36.7 C) (Oral)   Resp 17   LMP 05/27/2016 (Approximate)   SpO2 100%   Physical Exam  Constitutional: She is oriented to person, place, and time. She appears well-developed and well-nourished.  Pt smells of alcohol.  HENT:  Head: Normocephalic and atraumatic.  Right Ear: External ear normal.  Left Ear: External ear normal.  Nose: Nose normal.  Mouth/Throat: Oropharynx is clear and moist.  Eyes: Pupils are equal, round, and reactive to light. Conjunctivae and EOM are normal.  Neck: Normal range of motion. Neck supple.  Cardiovascular: Normal rate, regular rhythm, normal heart sounds and intact distal pulses.   Pulmonary/Chest: Effort normal and breath sounds normal.  Abdominal: Soft. Bowel sounds are  normal.  Musculoskeletal: Normal range of motion.  Neurological: She is alert and oriented to person, place, and time. Coordination abnormal.  Pt intoxicated, but is following commands.  Skin: Skin is warm. Capillary refill takes less than 2 seconds.  Psychiatric: She is agitated.  Nursing note and vitals reviewed.    ED Treatments / Results  Labs (all labs ordered are listed, but only abnormal results are displayed) Labs Reviewed  COMPREHENSIVE METABOLIC PANEL - Abnormal; Notable for the following:       Result Value   CO2 21 (*)    Calcium 8.6 (*)    AST 43 (*)    All other components within normal limits  ETHANOL - Abnormal; Notable for the following:    Alcohol, Ethyl (B) 449 (*)     All other components within normal limits  ACETAMINOPHEN LEVEL - Abnormal; Notable for the following:    Acetaminophen (Tylenol), Serum <10 (*)    All other components within normal limits  CBC  RAPID URINE DRUG SCREEN, HOSP PERFORMED  PREGNANCY, URINE  SALICYLATE LEVEL  URINALYSIS, ROUTINE W REFLEX MICROSCOPIC    EKG  EKG Interpretation None       Radiology No results found.  Procedures Procedures (including critical care time)  Medications Ordered in ED Medications  sodium chloride 0.9 % bolus 1,000 mL (0 mLs Intravenous Stopped 09/14/17 1720)     Initial Impression / Assessment and Plan / ED Course  I have reviewed the triage vital signs and the nursing notes.  Pertinent labs & imaging results that were available during my care of the patient were reviewed by me and considered in my medical decision making (see chart for details).     Pt is now awake and alert.  She is ambulatory.  She has had 2 meals.  She wants to go home.  She is calling her friend to pick her up.  She said [place].  She is given Psychologist, clinicalresource guides to [place] detox treatment programs if needed.  She knows to return if worse.  Final Clinical Impressions(s) / ED Diagnoses   Final diagnoses:  ETOH abuse  Alcoholic intoxication without complication [facility](HCC)    New Prescriptions New Prescriptions   No medications on file     Jacalyn LefevreHaviland, Tobechukwu Emmick, MD 09/14/17 [phone]

## 2017-09-17 ENCOUNTER — Encounter (HOSPITAL_COMMUNITY): Payer: Self-pay

## 2017-11-23 ENCOUNTER — Emergency Department (HOSPITAL_COMMUNITY)
Admission: EM | Admit: 2017-11-23 | Discharge: 2017-11-23 | Disposition: A | Discharge status: Home / Self Care | Payer: Medicaid Other

## 2017-12-18 ENCOUNTER — Encounter (HOSPITAL_COMMUNITY): Payer: Self-pay | Admitting: Emergency Medicine

## 2017-12-18 DIAGNOSIS — F1099 Alcohol use, unspecified with unspecified alcohol-induced disorder: Secondary | ICD-10-CM | POA: Insufficient documentation

## 2017-12-18 DIAGNOSIS — Z5321 Procedure and treatment not carried out due to patient leaving prior to being seen by health care provider: Secondary | ICD-10-CM | POA: Insufficient documentation

## 2017-12-18 LAB — CBC
HCT: 40.4 % (ref 36.0–46.0)
Hemoglobin: 13.4 g/dL (ref 12.0–15.0)
MCH: 31.8 pg (ref 26.0–34.0)
MCHC: 33.2 g/dL (ref 30.0–36.0)
MCV: 96 fL (ref 78.0–100.0)
PLATELETS: 277 10*3/uL (ref 150–400)
RBC: 4.21 MIL/uL (ref 3.87–5.11)
RDW: 14.2 % (ref 11.5–15.5)
WBC: 4.4 10*3/uL (ref 4.0–10.5)

## 2017-12-18 LAB — COMPREHENSIVE METABOLIC PANEL
ALK PHOS: 52 U/L (ref 38–126)
ALT: 30 U/L (ref 14–54)
ANION GAP: 16 — AB (ref 5–15)
AST: 59 U/L — ABNORMAL HIGH (ref 15–41)
Albumin: 4.3 g/dL (ref 3.5–5.0)
BUN: 5 mg/dL — ABNORMAL LOW (ref 6–20)
CALCIUM: 9.2 mg/dL (ref 8.9–10.3)
CO2: 23 mmol/L (ref 22–32)
Chloride: 104 mmol/L (ref 101–111)
Creatinine, Ser: 0.62 mg/dL (ref 0.44–1.00)
GFR calc non Af Amer: >60 mL/min (ref >60)
Glucose, Bld: 90 mg/dL (ref 65–99)
Potassium: 4.4 mmol/L (ref 3.5–5.1)
SODIUM: 143 mmol/L (ref 135–145)
TOTAL PROTEIN: 7.7 g/dL (ref 6.5–8.1)
Total Bilirubin: 0.7 mg/dL (ref 0.3–1.2)

## 2017-12-18 LAB — ETHANOL: Alcohol, Ethyl (B): 385 mg/dL (ref <10)

## 2017-12-18 LAB — I-STAT BETA HCG BLOOD, ED (MC, WL, AP ONLY)
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
## 2017-12-19 ENCOUNTER — Emergency Department (HOSPITAL_COMMUNITY)
Admission: EM | Admit: 2017-12-19 | Discharge: 2017-12-19 | Disposition: A | Discharge status: Home / Self Care | Payer: Self-pay | Attending: Emergency Medicine | Admitting: Emergency Medicine

## 2017-12-19 NOTE — ED Notes (Signed)
Pt not visualized in lobby at this time. Presumed to have left without being seen.

## 2018-04-13 IMAGING — US US MFM OB DETAIL+14 WK
1 series · 13 of 28 positions shown · non-contrast
Comparison: none

[Series 1: us mfm ob detail+14 wk · 13 of 128 slices shown]
[im 5/128]
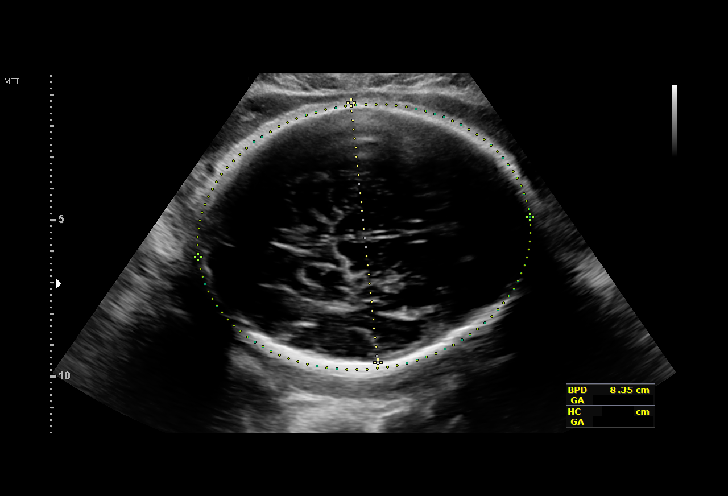
[im 15/128]
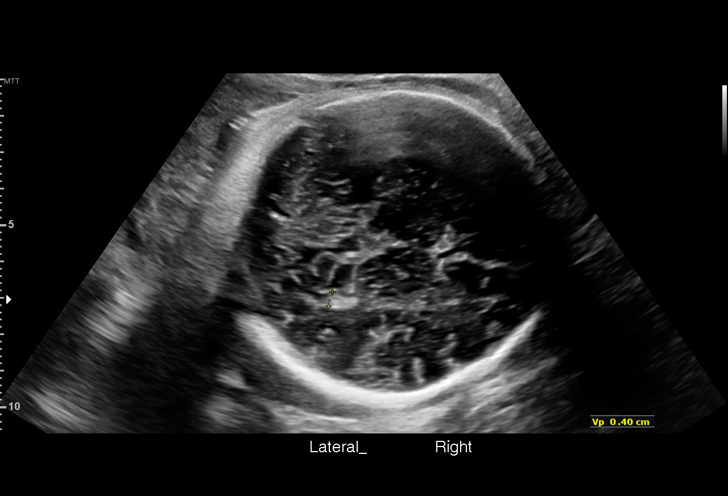
[im 24/128]
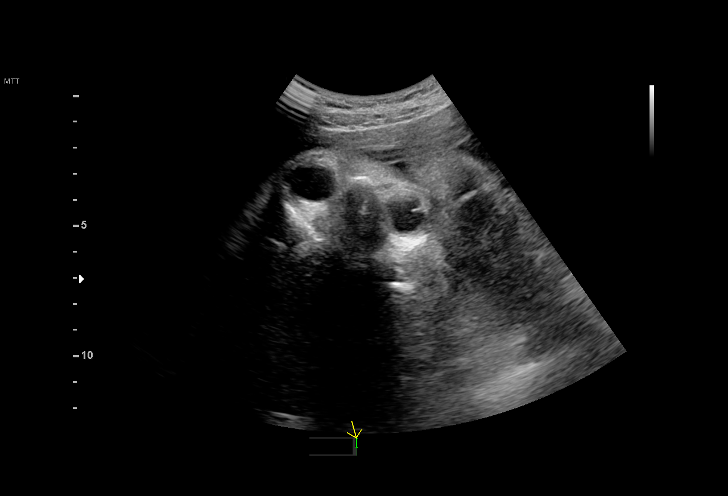
[im 33/128]
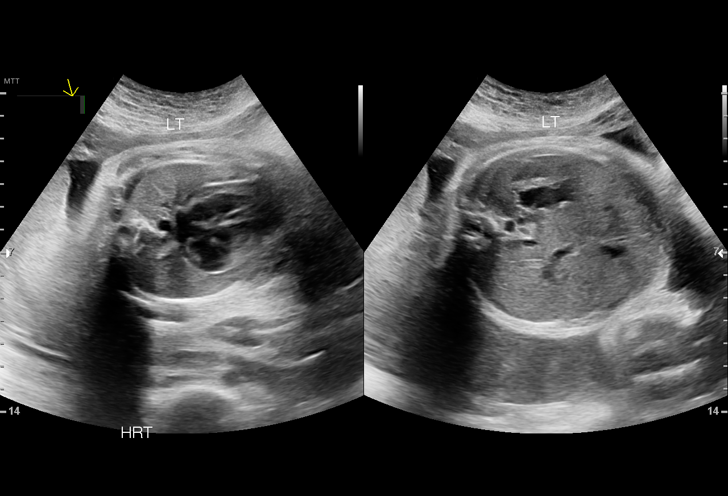
[im 43/128]
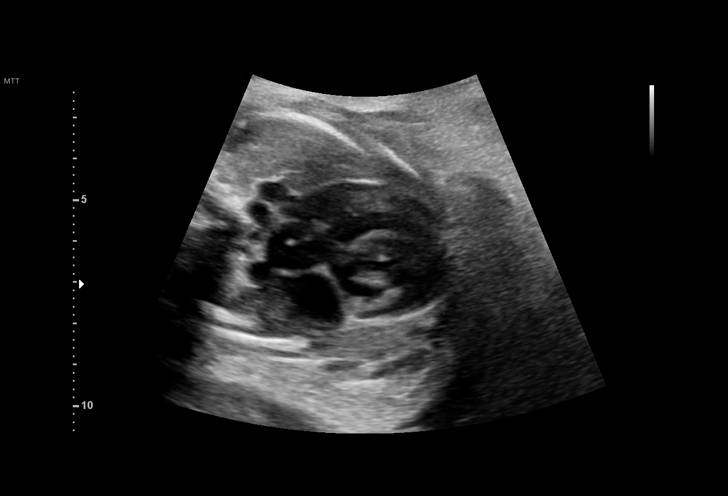
[im 52/128]
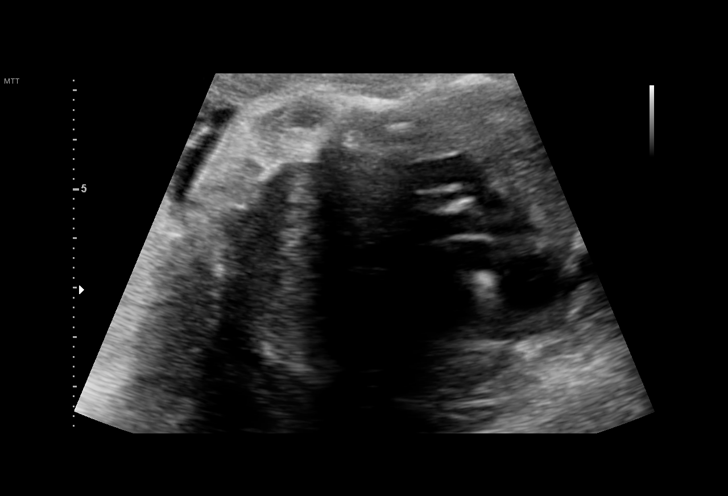
[im 66/128]
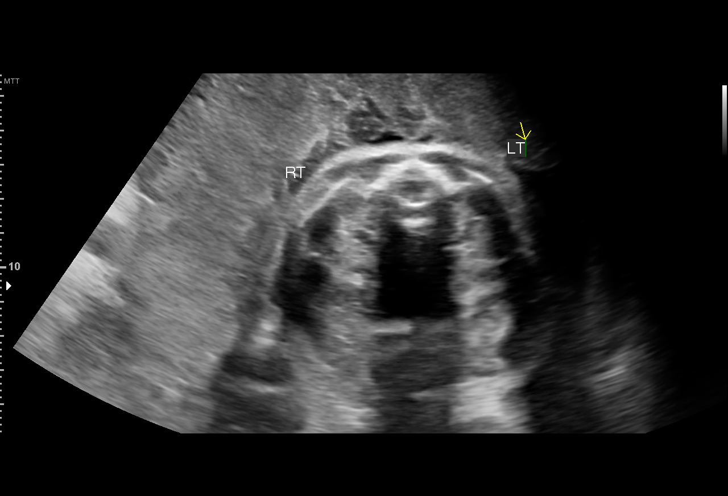
[im 76/128]
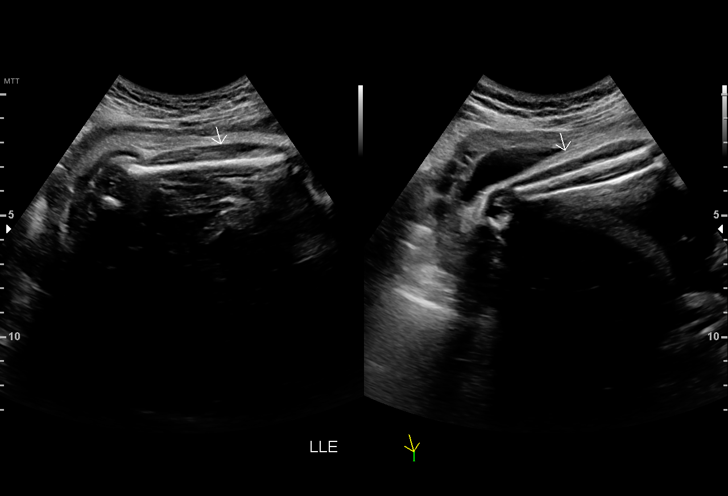
[im 85/128]
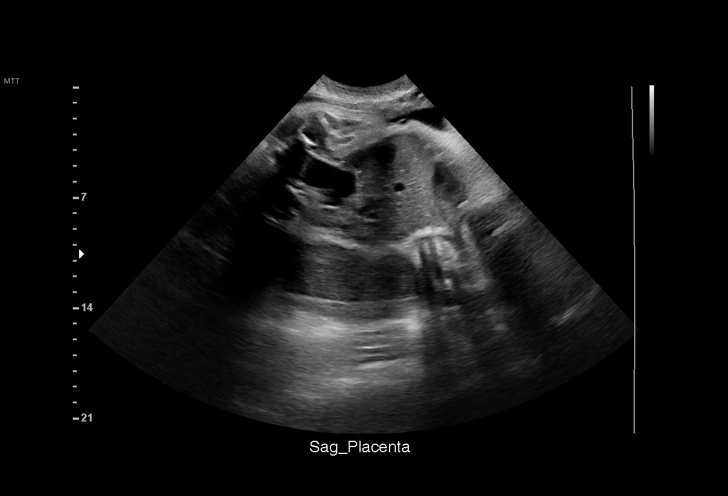
[im 95/128]
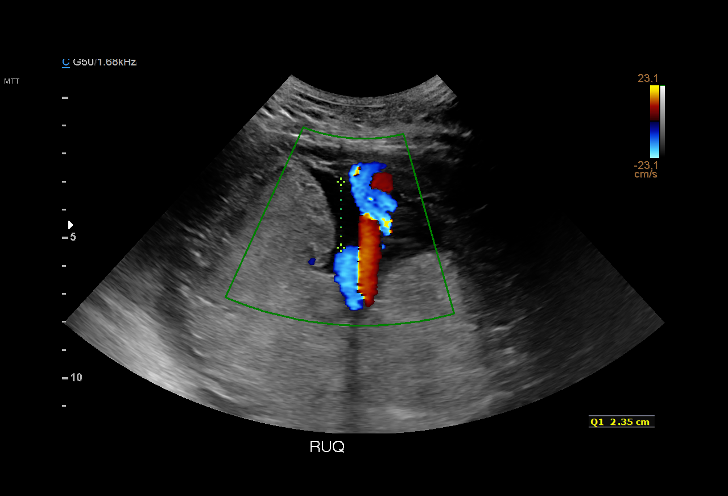
[im 104/128]
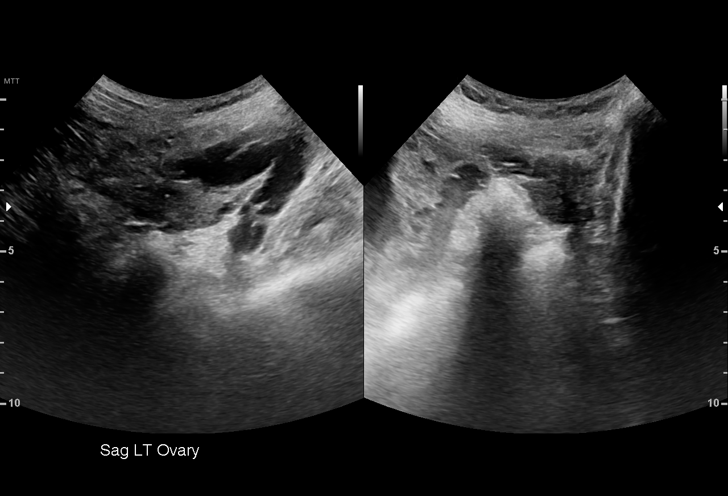
[im 113/128]
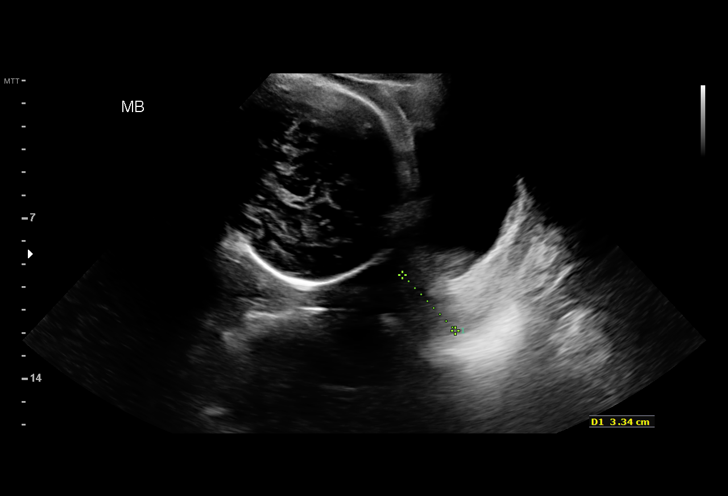
[im 123/128]
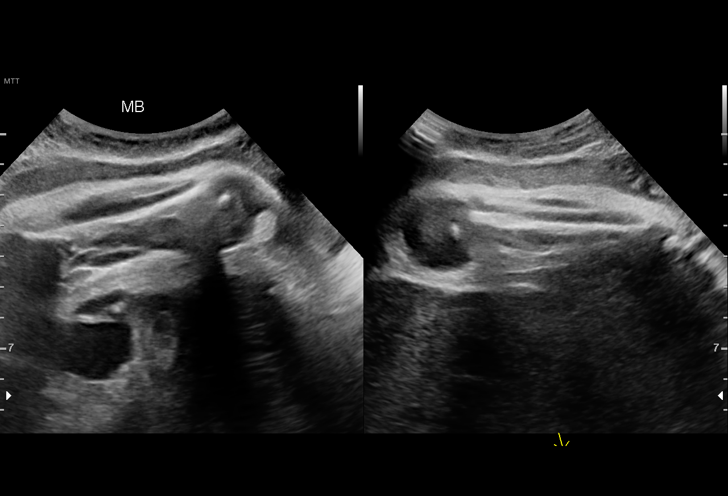

[13 of 28 positions shown; findings below may reference images not displayed]

[REDACTED]
OB/Gyn Clinic

Indications

33 weeks gestation of pregnancy
Encounter for antenatal screening for
malformations
Drug use complicating pregnancy, third
trimester
Late to prenatal care, third trimester
Tobacco use complicating pregnancy, third
trimester
Other mental disorder complicating
pregnancy, third trimester
Maternal alcohol use complicating
pregnancy, antepartum
OB History

Blood Type:            Height:  5'1"   Weight (lb):  140       BMI:
Gravidity:    1         Term:   0        Prem:   0         SAB:   0
TOP:          0       Ectopic:  0        Living: 0
Fetal Evaluation

Num Of Fetuses:     1
Fetal Heart         164
Rate(bpm):
Cardiac Activity:   Observed
Presentation:       Cephalic
Placenta:           Right posterior, above cervical os
P. Cord Insertion:  Visualized, central

Amniotic Fluid
AFI FV:      Subjectively within normal limits

AFI Sum(cm)     %Tile       Largest Pocket(cm)
14.44           51

RUQ(cm)       RLQ(cm)       LUQ(cm)        LLQ(cm)
2.35
Biometry

BPD:      83.6  mm     G. Age:  33w 4d         40  %    CI:         76.33  %    70 - 86
FL/HC:       21.3  %    19.4 -
HC:      303.2  mm     G. Age:  33w 5d         14  %    HC/AC:       0.99       0.96 -
AC:      307.1  mm     G. Age:  34w 5d         76  %    FL/BPD:      77.3  %    71 - 87
FL:       64.6  mm     G. Age:  33w 2d         26  %    FL/AC:       21.0  %    20 - 24
HUM:      55.4  mm     G. Age:  32w 2d         24  %
CER:      45.3  mm     G. Age:  N/A          > 95  %

CM:        6.5  mm

Est. FW:    7813   gm     5 lb 3 oz     64  %
Gestational Age

LMP:           32w 4d        Date:  05/27/16                 EDD:    03/03/17
U/S Today:     33w 6d                                        EDD:    02/22/17
Best:          33w 6d     Det. By:  U/S (01/10/17)           EDD:    02/22/17
Anatomy

Cranium:               Appears normal         Aortic Arch:            Appears normal
Cavum:                 Appears normal         Ductal Arch:            Appears normal
Ventricles:            Appears normal         Diaphragm:              Appears normal
Choroid Plexus:        Appears normal         Stomach:                Appears normal, left
sided
Cerebellum:            Appears normal         Abdomen:                Appears normal
Posterior Fossa:       Appears normal         Abdominal Wall:         Appears nml (cord
insert, abd wall)
Nuchal Fold:           Not applicable (>20    Cord Vessels:           Appears normal (3
wks GA)                                        vessel cord)
Face:                  Appears normal         Kidneys:                Appear normal
(orbits and profile)
Lips:                  Appears normal         Bladder:                Appears normal
Thoracic:              Appears normal         Spine:                  Not well visualized
Heart:                 Appears normal         Upper Extremities:      Visualized
(4CH, axis, and situs
RVOT:                  Appears normal         Lower Extremities:      Visualized
LVOT:                  Appears normal

Other:  Fetus appears to be a female. Technically difficult due to advanced
GA and fetal position.
Cervix Uterus Adnexa
Cervix
Length:           3.66  cm.
Normal appearance by transabdominal scan.

Uterus
No abnormality visualized.

Left Ovary
Size(cm)     2.56   x   1.66   x  1.45      Vol(ml):
Within normal limits. No adnexal mass visualized.

Right Ovary
Size(cm)     1.91   x   2.76   x  2.31      Vol(ml):
Within normal limits. No adnexal mass visualized.

Cul De Sac:   No free fluid seen.

Adnexa:       No abnormality visualized.
Impression

SIUP at 33+6 weeks
Normal detailed fetal anatomy; limited views of spine
Normal amniotic fluid volume
EDC based on today's measurements: 02/22/17
Recommendations

Follow-up as clinically indicated

## 2018-09-13 DIAGNOSIS — T50901A Poisoning by unspecified drugs, medicaments and biological substances, accidental (unintentional), initial encounter: Secondary | ICD-10-CM

## 2018-09-13 DIAGNOSIS — G92 Toxic encephalopathy: Secondary | ICD-10-CM

## 2018-09-13 DIAGNOSIS — F1910 Other psychoactive substance abuse, uncomplicated: Secondary | ICD-10-CM
